# Patient Record
Sex: Female | Born: 1960 | Race: Black or African American | Hispanic: No | Smoking: Never smoker
Health system: Southern US, Community
[De-identification: ages and names within clinical notes are randomized; demographics above are authoritative.]

## PROBLEM LIST (undated history)

## (undated) DIAGNOSIS — K2970 Gastritis, unspecified, without bleeding: Secondary | ICD-10-CM

## (undated) DIAGNOSIS — K5900 Constipation, unspecified: Secondary | ICD-10-CM

## (undated) DIAGNOSIS — K2960 Other gastritis without bleeding: Secondary | ICD-10-CM

## (undated) DIAGNOSIS — I10 Essential (primary) hypertension: Secondary | ICD-10-CM

## (undated) DIAGNOSIS — J309 Allergic rhinitis, unspecified: Secondary | ICD-10-CM

## (undated) DIAGNOSIS — D649 Anemia, unspecified: Secondary | ICD-10-CM

## (undated) DIAGNOSIS — J45909 Unspecified asthma, uncomplicated: Secondary | ICD-10-CM

## (undated) DIAGNOSIS — G5600 Carpal tunnel syndrome, unspecified upper limb: Secondary | ICD-10-CM

## (undated) DIAGNOSIS — E669 Obesity, unspecified: Secondary | ICD-10-CM

## (undated) DIAGNOSIS — K649 Unspecified hemorrhoids: Secondary | ICD-10-CM

## (undated) DIAGNOSIS — E119 Type 2 diabetes mellitus without complications: Secondary | ICD-10-CM

## (undated) DIAGNOSIS — R1310 Dysphagia, unspecified: Secondary | ICD-10-CM

## (undated) DIAGNOSIS — K2980 Duodenitis without bleeding: Secondary | ICD-10-CM

## (undated) HISTORY — PX: BLADDER SURGERY: SHX569

## (undated) HISTORY — PX: CARPAL TUNNEL RELEASE: SHX101

## (undated) HISTORY — PX: OOPHORECTOMY: SHX86

## (undated) HISTORY — DX: Obesity, unspecified: E66.9

## (undated) HISTORY — DX: Carpal tunnel syndrome, unspecified upper limb: G56.00

## (undated) HISTORY — DX: Allergic rhinitis, unspecified: J30.9

## (undated) HISTORY — DX: Dysphagia, unspecified: R13.10

## (undated) HISTORY — PX: MYOMECTOMY: SHX85

## (undated) HISTORY — DX: Gastritis, unspecified, without bleeding: K29.70

## (undated) HISTORY — DX: Type 2 diabetes mellitus without complications: E11.9

## (undated) HISTORY — DX: Unspecified hemorrhoids: K64.9

## (undated) HISTORY — PX: ABDOMINAL HYSTERECTOMY: SHX81

## (undated) HISTORY — DX: Essential (primary) hypertension: I10

## (undated) HISTORY — DX: Other gastritis without bleeding: K29.60

## (undated) HISTORY — DX: Anemia, unspecified: D64.9

## (undated) HISTORY — DX: Unspecified asthma, uncomplicated: J45.909

## (undated) HISTORY — DX: Duodenitis without bleeding: K29.80

## (undated) HISTORY — DX: Constipation, unspecified: K59.00

---

## 1999-09-02 ENCOUNTER — Ambulatory Visit (HOSPITAL_COMMUNITY): Admission: RE | Admit: 1999-09-02 | Discharge: 1999-09-02 | Payer: Self-pay | Admitting: Family Medicine

## 1999-09-02 ENCOUNTER — Encounter: Payer: Self-pay | Admitting: Family Medicine

## 1999-10-19 ENCOUNTER — Encounter: Payer: Self-pay | Admitting: Urology

## 1999-10-19 ENCOUNTER — Ambulatory Visit (HOSPITAL_COMMUNITY): Admission: RE | Admit: 1999-10-19 | Discharge: 1999-10-19 | Payer: Self-pay | Admitting: Urology

## 1999-11-03 ENCOUNTER — Ambulatory Visit (HOSPITAL_COMMUNITY): Admission: RE | Admit: 1999-11-03 | Discharge: 1999-11-03 | Payer: Self-pay | Admitting: Urology

## 1999-11-03 ENCOUNTER — Encounter: Payer: Self-pay | Admitting: Urology

## 2000-12-13 ENCOUNTER — Other Ambulatory Visit: Admission: RE | Admit: 2000-12-13 | Discharge: 2000-12-13 | Payer: Self-pay | Admitting: Obstetrics

## 2000-12-22 ENCOUNTER — Ambulatory Visit (HOSPITAL_COMMUNITY): Admission: RE | Admit: 2000-12-22 | Discharge: 2000-12-22 | Payer: Self-pay | Admitting: Urology

## 2000-12-22 ENCOUNTER — Encounter: Payer: Self-pay | Admitting: Urology

## 2001-03-02 ENCOUNTER — Encounter: Payer: Self-pay | Admitting: Obstetrics

## 2001-03-02 ENCOUNTER — Ambulatory Visit (HOSPITAL_COMMUNITY): Admission: RE | Admit: 2001-03-02 | Discharge: 2001-03-02 | Payer: Self-pay | Admitting: Obstetrics

## 2001-04-18 ENCOUNTER — Ambulatory Visit: Admission: RE | Admit: 2001-04-18 | Discharge: 2001-04-18 | Payer: Self-pay | Admitting: Gynecology

## 2001-04-26 ENCOUNTER — Inpatient Hospital Stay (HOSPITAL_COMMUNITY): Admission: RE | Admit: 2001-04-26 | Discharge: 2001-04-30 | Payer: Self-pay | Admitting: Obstetrics

## 2001-05-02 ENCOUNTER — Ambulatory Visit: Admission: RE | Admit: 2001-05-02 | Discharge: 2001-05-02 | Payer: Self-pay | Admitting: Gynecology

## 2001-05-15 ENCOUNTER — Ambulatory Visit (HOSPITAL_COMMUNITY): Admission: RE | Admit: 2001-05-15 | Discharge: 2001-05-15 | Payer: Self-pay | Admitting: Gynecology

## 2001-05-15 ENCOUNTER — Encounter: Payer: Self-pay | Admitting: Gynecology

## 2001-05-27 ENCOUNTER — Emergency Department (HOSPITAL_COMMUNITY): Admission: EM | Admit: 2001-05-27 | Discharge: 2001-05-28 | Payer: Self-pay | Admitting: Emergency Medicine

## 2001-06-01 ENCOUNTER — Ambulatory Visit (HOSPITAL_COMMUNITY): Admission: RE | Admit: 2001-06-01 | Discharge: 2001-06-01 | Payer: Self-pay | Admitting: Gynecology

## 2001-06-01 ENCOUNTER — Encounter: Payer: Self-pay | Admitting: Gynecology

## 2001-06-21 ENCOUNTER — Ambulatory Visit: Admission: RE | Admit: 2001-06-21 | Discharge: 2001-06-21 | Payer: Self-pay | Admitting: Gynecology

## 2002-06-05 ENCOUNTER — Ambulatory Visit (HOSPITAL_COMMUNITY): Admission: RE | Admit: 2002-06-05 | Discharge: 2002-06-05 | Payer: Self-pay | Admitting: Orthopedic Surgery

## 2002-06-18 ENCOUNTER — Encounter: Admission: RE | Admit: 2002-06-18 | Discharge: 2002-09-16 | Payer: Self-pay | Admitting: Family Medicine

## 2004-01-20 ENCOUNTER — Encounter: Admission: RE | Admit: 2004-01-20 | Discharge: 2004-04-19 | Payer: Self-pay | Admitting: Family Medicine

## 2005-11-22 HISTORY — PX: COLONOSCOPY: SHX174

## 2005-12-07 ENCOUNTER — Ambulatory Visit: Payer: Self-pay | Admitting: Family Medicine

## 2005-12-14 ENCOUNTER — Ambulatory Visit: Payer: Self-pay | Admitting: Family Medicine

## 2006-01-27 ENCOUNTER — Ambulatory Visit: Payer: Self-pay | Admitting: Family Medicine

## 2006-01-28 ENCOUNTER — Ambulatory Visit: Payer: Self-pay | Admitting: Family Medicine

## 2006-02-04 ENCOUNTER — Ambulatory Visit: Payer: Self-pay | Admitting: Internal Medicine

## 2006-02-15 ENCOUNTER — Ambulatory Visit: Payer: Self-pay | Admitting: Internal Medicine

## 2006-02-15 DIAGNOSIS — R1310 Dysphagia, unspecified: Secondary | ICD-10-CM

## 2006-02-15 DIAGNOSIS — K2960 Other gastritis without bleeding: Secondary | ICD-10-CM

## 2006-02-15 DIAGNOSIS — K2970 Gastritis, unspecified, without bleeding: Secondary | ICD-10-CM

## 2006-02-15 DIAGNOSIS — K2980 Duodenitis without bleeding: Secondary | ICD-10-CM

## 2006-02-15 HISTORY — DX: Duodenitis without bleeding: K29.80

## 2006-02-15 HISTORY — DX: Gastritis, unspecified, without bleeding: K29.70

## 2006-02-15 HISTORY — DX: Other gastritis without bleeding: K29.60

## 2006-02-15 HISTORY — DX: Dysphagia, unspecified: R13.10

## 2007-08-24 DIAGNOSIS — D649 Anemia, unspecified: Secondary | ICD-10-CM | POA: Insufficient documentation

## 2007-08-24 DIAGNOSIS — J309 Allergic rhinitis, unspecified: Secondary | ICD-10-CM | POA: Insufficient documentation

## 2007-08-24 DIAGNOSIS — E669 Obesity, unspecified: Secondary | ICD-10-CM | POA: Insufficient documentation

## 2007-08-24 DIAGNOSIS — E119 Type 2 diabetes mellitus without complications: Secondary | ICD-10-CM | POA: Insufficient documentation

## 2007-08-24 DIAGNOSIS — I10 Essential (primary) hypertension: Secondary | ICD-10-CM | POA: Insufficient documentation

## 2008-03-12 ENCOUNTER — Telehealth: Payer: Self-pay | Admitting: Internal Medicine

## 2012-03-02 ENCOUNTER — Other Ambulatory Visit (HOSPITAL_COMMUNITY): Payer: Self-pay | Admitting: Internal Medicine

## 2012-03-02 DIAGNOSIS — Z1231 Encounter for screening mammogram for malignant neoplasm of breast: Secondary | ICD-10-CM

## 2012-03-07 ENCOUNTER — Ambulatory Visit (HOSPITAL_COMMUNITY)
Admission: RE | Admit: 2012-03-07 | Discharge: 2012-03-07 | Disposition: A | Discharge status: Home / Self Care | Payer: Self-pay | Source: Ambulatory Visit | Attending: Internal Medicine | Admitting: Internal Medicine

## 2012-03-07 DIAGNOSIS — Z1231 Encounter for screening mammogram for malignant neoplasm of breast: Secondary | ICD-10-CM

## 2012-05-17 ENCOUNTER — Encounter: Payer: Self-pay | Admitting: Internal Medicine

## 2012-06-12 ENCOUNTER — Ambulatory Visit: Payer: Self-pay | Admitting: Internal Medicine

## 2012-06-13 ENCOUNTER — Encounter: Payer: Self-pay | Admitting: Internal Medicine

## 2015-01-20 ENCOUNTER — Emergency Department (HOSPITAL_COMMUNITY)
Admission: EM | Admit: 2015-01-20 | Discharge: 2015-01-20 | Disposition: A | Discharge status: Home / Self Care | Payer: Self-pay | Attending: Emergency Medicine | Admitting: Emergency Medicine

## 2015-01-20 ENCOUNTER — Encounter (HOSPITAL_COMMUNITY): Payer: Self-pay | Admitting: Emergency Medicine

## 2015-01-20 DIAGNOSIS — I10 Essential (primary) hypertension: Secondary | ICD-10-CM | POA: Insufficient documentation

## 2015-01-20 DIAGNOSIS — Z862 Personal history of diseases of the blood and blood-forming organs and certain disorders involving the immune mechanism: Secondary | ICD-10-CM | POA: Insufficient documentation

## 2015-01-20 DIAGNOSIS — Z8719 Personal history of other diseases of the digestive system: Secondary | ICD-10-CM | POA: Insufficient documentation

## 2015-01-20 DIAGNOSIS — R739 Hyperglycemia, unspecified: Secondary | ICD-10-CM

## 2015-01-20 DIAGNOSIS — J45909 Unspecified asthma, uncomplicated: Secondary | ICD-10-CM | POA: Insufficient documentation

## 2015-01-20 DIAGNOSIS — L0231 Cutaneous abscess of buttock: Secondary | ICD-10-CM | POA: Insufficient documentation

## 2015-01-20 DIAGNOSIS — E669 Obesity, unspecified: Secondary | ICD-10-CM | POA: Insufficient documentation

## 2015-01-20 DIAGNOSIS — L03317 Cellulitis of buttock: Secondary | ICD-10-CM | POA: Insufficient documentation

## 2015-01-20 DIAGNOSIS — E1165 Type 2 diabetes mellitus with hyperglycemia: Secondary | ICD-10-CM | POA: Insufficient documentation

## 2015-01-20 LAB — CBG MONITORING, ED: Glucose-Capillary: 464 mg/dL — ABNORMAL HIGH (ref 70–99)

## 2015-01-20 MED ORDER — CEPHALEXIN 500 MG PO CAPS: 500.0000 mg | ORAL_CAPSULE | Freq: Four times a day (QID) | ORAL | Status: DC

## 2015-01-20 MED ORDER — SULFAMETHOXAZOLE-TRIMETHOPRIM 800-160 MG PO TABS: 1.0000 | ORAL_TABLET | Freq: Two times a day (BID) | ORAL | Status: AC

## 2015-01-20 MED ORDER — HYDROCODONE-ACETAMINOPHEN 5-325 MG PO TABS
1.0000 | ORAL_TABLET | Freq: Once | ORAL | Status: AC
  Administered 2015-01-20: 1 via ORAL
  Filled 2015-01-20: qty 1

## 2015-01-20 MED ORDER — LIDOCAINE-EPINEPHRINE 2 %-1:100000 IJ SOLN
20.0000 mL | Freq: Once | INTRAMUSCULAR | Status: AC
  Administered 2015-01-20: 1 mL
  Filled 2015-01-20: qty 1

## 2015-01-20 NOTE — Discharge Instructions (Signed)
Return to the emergency room with worsening of symptoms, new symptoms or with symptoms that are concerning, especially fevers, nausea vomiting, increased redness, swelling, pus drainage, red streaks. Please take all of your antibiotics until finished!   You may develop abdominal discomfort or diarrhea from the antibiotic.  You may help offset this with probiotics which you can buy or get in yogurt. Do not eat  or take the probiotics until 2 hours after your antibiotic.  You have hyperglycemia that is likely diabetes. You need to be on medications to decrease your glucose levels and improve your health.  Please call your doctor for a followup appointment within 24-48 hours. When you talk to your doctor please let them know that you were seen in the emergency department and have them acquire all of your records so that they can discuss the findings with you and formulate a treatment plan to fully care for your new and ongoing problems. If you do not have a primary care provider please call the number below under ED resources to establish care with a provider and follow up.  Read below information and follow recommendations.   Cellulitis Cellulitis is an infection of the skin and the tissue beneath it. The infected area is usually red and tender. Cellulitis occurs most often in the arms and lower legs.  CAUSES  Cellulitis is caused by bacteria that enter the skin through cracks or cuts in the skin. The most common types of bacteria that cause cellulitis are staphylococci and streptococci. SIGNS AND SYMPTOMS   Redness and warmth.  Swelling.  Tenderness or pain.  Fever. DIAGNOSIS  Your health care provider can usually determine what is wrong based on a physical exam. Blood tests may also be done. TREATMENT  Treatment usually involves taking an antibiotic medicine. HOME CARE INSTRUCTIONS   Take your antibiotic medicine as directed by your health care provider. Finish the antibiotic even if you  start to feel better.  Keep the infected arm or leg elevated to reduce swelling.  Apply a warm cloth to the affected area up to 4 times per day to relieve pain.  Take medicines only as directed by your health care provider.  Keep all follow-up visits as directed by your health care provider. SEEK MEDICAL CARE IF:   You notice red streaks coming from the infected area.  Your red area gets larger or turns dark in color.  Your bone or joint underneath the infected area becomes painful after the skin has healed.  Your infection returns in the same area or another area.  You notice a swollen bump in the infected area.  You develop new symptoms.  You have a fever. SEEK IMMEDIATE MEDICAL CARE IF:   You feel very sleepy.  You develop vomiting or diarrhea.  You have a general ill feeling (malaise) with muscle aches and pains. MAKE SURE YOU:   Understand these instructions.  Will watch your condition.  Will get help right away if you are not doing well or get worse. Document Released: 08/18/2005 Document Revised: 03/25/2014 Document Reviewed: 01/24/2012 [facility] Patient Information 2015 [place], [place]. This information is not intended to replace advice given to you by your health care provider. Make sure you discuss any questions you have with your health care provider.   Emergency Department Resource Guide 1) Find a Doctor and Pay Out of Pocket Although you won't have to find out who is covered by your insurance plan, it is a good idea to ask around and  get recommendations. You will then need to call the office and see if the doctor you have chosen will accept you as a new patient and what types of options they offer for patients who are self-pay. Some doctors offer discounts or will set up payment plans for their patients who do not have insurance, but you will need to ask so you aren't surprised when you get to your appointment.  2) Contact Your Local Health Department Not  all health departments have doctors that can see patients for sick visits, but many do, so it is worth a call to see if yours does. If you don't know where your local health department is, you can check in your phone book. The CDC also has a tool to help you locate your state's health department, and many state websites also have listings of all of their local health departments.  3) Find a Walk-in Clinic If your illness is not likely to be very severe or complicated, you may want to try a walk in clinic. These are popping up all over the country in pharmacies, drugstores, and shopping centers. They're usually staffed by nurse practitioners or physician assistants that have been trained to treat common illnesses and complaints. They're usually fairly quick and inexpensive. However, if you have serious medical issues or chronic medical problems, these are probably not your best option.  No Primary Care Doctor: - Call Health Connect at  [phone] - they can help you locate a primary care doctor that  accepts your insurance, provides certain services, etc. - Physician Referral Service- [phone]  Chronic Pain Problems: Organization         Address  Phone   Notes  [facility]  [phone] Patients need to be referred by their primary care doctor.   Medication Assistance: Organization         Address  Phone   Notes  [facility] [address] [place], [place] [id number] [phone] --Must be a resident of [place] -- Must have NO insurance coverage whatsoever (no Medicaid/ Medicare, etc.) -- The pt. MUST have a primary care doctor that directs their care regularly and follows them in the community   MedAssist  [phone]   [employer]  [phone]    Agencies that provide inexpensive medical care: Organization         Address  Phone   Notes  [facility]  [phone]   [facility]    [phone]   [facility] [address] [place], [place] [address] [phone]   [facility] [address], [place] [phone]   [facility]anned Parenthood    [phone]   [facility]    [phone]   Community Health and [facility]  [address], [place] Phone:  [phone], Fax:  [phone] Hours of Operation:  9 am - 6 pm, M-F.  Also accepts Medicaid/Medicare and self-pay.  [facility]  [address], [place] Phone: [phone], Fax: [phone]. Hours of Operation:  8:30 am - 5:30 pm, M-F.  Also accepts Medicaid and self-pay.  [facility] [place] [address], [place] Phone: [phone]   [facility] [address], [place], [place] [phone], Ext. [phone] Mondays & Thursdays: 7-9 AM.  First 15 patients are seen on a  first come, first serve basis.    Medicaid-accepting [facility] Providers:  Organization         Address  Phone   Notes  [facility] [address], [place] [phone] Also accepts self-pay patients.  [facility] [address] Laurell Josephs [place], [place]  [phone]   [facility] [address], [place] [phone]   [facility] [address] [place] [phone]   Renaye Rakers [address], [place]   [phone] Only accepts [place] Access [place] patients after they have their name applied to their card.   Self-Pay (no insurance) in [facility]:  Organization         Address  Phone   Notes  Sickle Cell Patients, [facility] Internal Medicine [address] [place], [place] [phone]   [facility] [address] [place], [place] [phone]   [facility] [place]  [address], [place] [phone]   [facility]/Dr. Osei-Bonsu  [address] [place] or [address], [place] [phone] Phone number for both [place] and [place] locations is the same.  Urgent Medical and [facility] [address], [place] [phone]   [facility] [address] [place] or [address] [phone]   [facility] [address] [place] [phone], phone; [phone], fax Sees patients 1st and 3rd Saturday of every month.  Must not qualify for public or private insurance (i.e. Medicaid, Medicare, [place] Health Choice, Veterans' Benefits)  Household income should be no more than 200% of the poverty level The clinic cannot treat you if you are pregnant or think you are pregnant  Sexually transmitted diseases are not treated at the clinic.    Dental Care: Organization         Address  Phone  Notes  [facility] Department of [facility] [address] [place], [place] [phone] Accepts children up to age 18 who are enrolled in [place] or [place] Health Choice; pregnant women with a Medicaid card; and children who have applied for Medicaid or [place] Health Choice, but were declined, whose parents can pay a reduced fee at time of service.  [facility] Department of [facility]  [address], [place] [phone] Accepts children up to age 28 who are enrolled in [place] or [place] Health Choice; pregnant women with a Medicaid card; and children who have applied for Medicaid or [place] Health Choice, but were declined, whose parents can pay a reduced fee at time of service.  [facility]  [address] [place], [place] [phone] Patients are seen by appointment only. Walk-ins are not accepted. [facility] will see patients 9 years of age and older. Monday - Tuesday (8am-5pm) Most Wednesdays (8:30-5pm) $30 per  visit, cash only  [facility] Adult Dental Access PROGRAM  [address], [facility] [phone] Patients are seen by appointment only. Walk-ins are not accepted. [facility] will see patients 56 years of age and older. One Wednesday Evening (Monthly: Volunteer Based).  $30 per visit, cash only  [employer]  [phone] for adults; Children under age 67, call Graduate Pediatric Dentistry at [phone]. Children aged  4-14, please call [phone] to request a pediatric application.  Dental services are provided in all areas of dental care including fillings, crowns and bridges, complete and partial dentures, implants, gum treatment, root canals, and extractions. Preventive care is also provided. Treatment is provided to both adults and children. Patients are selected via a lottery and there is often a waiting list.   [facility] [address], [place]  [phone] [email or web address]   [facility] [address] [place], [place] [phone], Ext. [phone] Second and Fourth Thursday of each month, opens at 6:30 AM; Clinic ends at 9 AM.  Patients are seen on a first-come first-served basis, and a limited number are seen during each clinic.   [facility]  [address] Hillard Danker [place], [place] [phone]   Eligibility Requirements You must have lived in [place], [place], or [place] counties for at least the last three months.   You cannot be eligible for state or federal sponsored [employer], including [employer], [place], or [employer].   You generally cannot be eligible for healthcare insurance through your employer.    How to apply: Eligibility screenings are held every Tuesday and Wednesday afternoon from 1:00 pm until 4:00 pm. You do not need an appointment for the interview!  [facility] [address] [place], [place]   [place]   [place] Department  [place]  [phone]    Behavioral Health Resources in the Community: Intensive Outpatient Programs Organization         Address  Phone  Notes  [facility] [place] [address], [place], [place] [phone]   [facility] Outpatient [address], [place], [place]   ADS: Alcohol & Drug Svcs [address], [place], [place]   [place] [address],  [place], [place] or [phone]   Substance Abuse Resources Organization         Address  Phone  Notes  Alcohol and Drug Services  [phone]   [place]  [phone]   The [place]   Chinita Pester  [phone]   Residential & Outpatient Substance Abuse Program  [phone]   Psychological Services Organization         Address  Phone  Notes  [facility]  [place]  [phone]   [place] [address] [place] or [phone]    Mobile Crisis Teams Organization         Address  Phone  Notes  [facility], Mobile Crisis Care Unit  [phone]   [facility]  [address] [place], [place]   [place] [address] [place] [phone]    Self-Help/Support Groups Organization         Address  Phone             Notes  [place] of [place] - variety of support groups  [place] Call for more information  Narcotics Anonymous (NA), [facility]aring [facility] [address], [employer] [place]  2 meetings at this location   Special educational needs teacher         Address  Phone  Notes  [facility] [place],    [place]  [phone]   [facility]  [address], [place] [address], [place], [place] [phone]     [facility] [address] [place], [place] [phone] Admissions: 8am-3pm M-F  [facility] [address],    [place], [place] [phone]   [facility] [address] [place], [place], [place] [phone]   [facility] [address]  [place], [place] [phone]   [facility] - Intensive Outpatient [address]urell Josephs [address], [place], [place] [phone]   [facility] ([facility] [address] [place]  [place], [place] [phone] or [phone]   [facility] ([facility]) [address], [place], [place] [phone] Accepts Medicaid  [facility] [address],  [place] [phone] Substance Abuse/Addiction Treatment   [facility] Organization         Address  Phone  Notes  [facility]  [phone]   Angie Fava, PhD [address] Ervin Knack [place], [place]   [phone] or [phone]   [facility] Behavioral   [address] [place], [place] [phone]   [facility] [address] [place], [place] [phone] Insurance/Medicaid/sponsorship through [facility] and Families [address]                                    [place], [place] [phone] Therapy/tele-psych/case  [facility] [address], [place] [phone]    Dr. Lolly Mustache  [phone]   [facility] [place]  United Way [facility] Community Dept. 1) [address] [place] 2) [address], [place] 3)  [address], [place] [phone]  [phone]   [facility] Child Abuse Hotline 954-124-6292 or [phone] (After Hours)

## 2015-01-20 NOTE — ED Provider Notes (Signed)
CSN: [id number]     Arrival date & time 01/20/15  1054 History   First MD Initiated Contact with Patient 01/20/15 1115     No chief complaint on file.    (Consider location/radiation/quality/duration/timing/severity/associated sxs/prior Treatment) HPI  Lindsey Johnston is a 54 y.o. female with PMH of borderline diabetes not on medications, hypertension, obesity presenting with one week of worsening "boil" to left back. Patient states it is grown in size with increased redness. She does report bloody and purulent discharge. She denies any fevers or chills nausea or vomiting. Patient with history of boils on her face that had resolved on their own.   Past Medical History  Diagnosis Date  . Anemia   . DM (diabetes mellitus)   . Obese   . Hypertension   . Allergic rhinitis   . Asthma   . Gastritis 02/15/2006    proximal non-erosive  . Erosive gastritis 02/15/2006  . Duodenitis 02/15/2006  . Dysphagia 02/15/2006    maloney dilation  . Hemorrhoids     internal and external   Past Surgical History  Procedure Laterality Date  . Abdominal hysterectomy    . Carpal tunnel release    . Bladder surgery      growth in bladder/sounds like adhesiolysis  . Oophorectomy    . Myomectomy     Family History  Problem Relation Age of Onset  . Diabetes Mother   . Diabetes Father   . Kidney failure Mother   . Obesity Mother   . Deep vein thrombosis Mother    History  Substance Use Topics  . Smoking status: Never Smoker   . Smokeless tobacco: Never Used  . Alcohol Use: No   OB History    No data available     Review of Systems  Constitutional: Negative for fever and chills.  Gastrointestinal: Negative for nausea and vomiting.  Skin: Negative for color change.       Boil      Allergies  Review of patient's allergies indicates no known allergies.  Home Medications   Prior to Admission medications   Medication Sig Start Date End Date Taking? Authorizing Provider  cephALEXin  (KEFLEX) 500 MG capsule Take 1 capsule (500 mg total) by mouth 4 (four) times daily. 01/20/15   Louann SjogrenVictoria L Sehaj Mcenroe, PA-C  sulfamethoxazole-trimethoprim (BACTRIM DS,SEPTRA DS) 800-160 MG per tablet Take 1 tablet by mouth 2 (two) times daily. 01/20/15 01/27/15  Benetta SparVictoria L Dorn Hartshorne, PA-C   BP 164/94 mmHg  Pulse 90  Temp(Src) 98.4 F (36.9 C)  Resp 16  SpO2 100% Physical Exam  Constitutional: She appears well-developed and well-nourished. No distress.  HENT:  Head: Normocephalic and atraumatic.  Eyes: Conjunctivae are normal. Right eye exhibits no discharge. Left eye exhibits no discharge.  Pulmonary/Chest: Effort normal. No respiratory distress.  Neurological: She is alert. Coordination normal.  Skin: She is not diaphoretic.  Patient with 2 cm area of fluctuance to left outer buttocks with surrounding erythema and induration. Evidence of cellulitis. Spontaneous bloody and purulent drainage. Very tender to palpation.  Psychiatric: She has a normal mood and affect. Her behavior is normal.  Nursing note and vitals reviewed.   ED Course  Procedures (including critical care time) Labs Review Labs Reviewed  CBG MONITORING, ED - Abnormal; Notable for the following:    Glucose-Capillary 464 (*)    All other components within normal limits    Imaging Review No results found.   EKG Interpretation None      INCISION  AND DRAINAGE Performed by: Louann Sjogren Consent: Verbal consent obtained. Risks and benefits: risks, benefits and alternatives were discussed Type: abscess  Body area: left buttocks  Anesthesia: local infiltration  Incision was made with a scalpel.  Local anesthetic: lidocaine 2% with epinephrine  Anesthetic total: 2 ml  Complexity: complex Blunt dissection to break up loculations  Drainage: purulent  Drainage amount: copious  Packing material: 1/4 in iodoform gauze  Patient tolerance: Patient tolerated the procedure well with no immediate  complications.     MDM   Final diagnoses:  Hyperglycemia  Abscess of buttock, left  Cellulitis of buttock, left   Patient with skin abscess amenable to incision and drainage.  Abscess packed with gauze and wound recheck in 2 days. Encouraged home warm soaks and flushing.  Patient also with cellulitis and will discharge with Bactrim as well as Keflex. Patient CBG also 464. Discussed obtaining lab work and starting on metformin but patient refused. Stressed the importance of following up with her primary care provider as well as the importance of controlling her hyperglycemia. Pt appears reliable for follow-up.  Discussed return precautions with patient. Discussed all results and patient verbalizes understanding and agrees with plan.   Louann Sjogren, PA-C 01/20/15 2120  Layla Maw Ward, DO 01/21/15 [phone]

## 2015-01-20 NOTE — ED Notes (Signed)
Pt reports abscess on left buttocks times 1 week that is draining with pain 8/10. Pt reports several in the past month that have "gone away on their own".

## 2015-01-22 ENCOUNTER — Emergency Department (HOSPITAL_COMMUNITY)
Admission: EM | Admit: 2015-01-22 | Discharge: 2015-01-22 | Disposition: A | Discharge status: Home / Self Care | Payer: Self-pay | Attending: Emergency Medicine | Admitting: Emergency Medicine

## 2015-01-22 ENCOUNTER — Encounter (HOSPITAL_COMMUNITY): Payer: Self-pay | Admitting: Emergency Medicine

## 2015-01-22 DIAGNOSIS — Z9889 Other specified postprocedural states: Secondary | ICD-10-CM | POA: Insufficient documentation

## 2015-01-22 DIAGNOSIS — L0231 Cutaneous abscess of buttock: Secondary | ICD-10-CM | POA: Insufficient documentation

## 2015-01-22 DIAGNOSIS — J45909 Unspecified asthma, uncomplicated: Secondary | ICD-10-CM | POA: Insufficient documentation

## 2015-01-22 DIAGNOSIS — E669 Obesity, unspecified: Secondary | ICD-10-CM | POA: Insufficient documentation

## 2015-01-22 DIAGNOSIS — I10 Essential (primary) hypertension: Secondary | ICD-10-CM | POA: Insufficient documentation

## 2015-01-22 DIAGNOSIS — Z862 Personal history of diseases of the blood and blood-forming organs and certain disorders involving the immune mechanism: Secondary | ICD-10-CM | POA: Insufficient documentation

## 2015-01-22 DIAGNOSIS — Z8719 Personal history of other diseases of the digestive system: Secondary | ICD-10-CM | POA: Insufficient documentation

## 2015-01-22 DIAGNOSIS — Z792 Long term (current) use of antibiotics: Secondary | ICD-10-CM | POA: Insufficient documentation

## 2015-01-22 DIAGNOSIS — E119 Type 2 diabetes mellitus without complications: Secondary | ICD-10-CM | POA: Insufficient documentation

## 2015-01-22 MED ORDER — HYDROCODONE-ACETAMINOPHEN 5-325 MG PO TABS: 1.0000 | ORAL_TABLET | Freq: Four times a day (QID) | ORAL | Status: DC | PRN

## 2015-01-22 NOTE — ED Provider Notes (Signed)
CSN: [id number]     Arrival date & time 01/22/15  1521 History   None    Chief Complaint  Patient presents with  . Wound Check   Patient is a 54 y.o. female presenting with wound check. The history is provided by the patient. No language interpreter was used.  Wound Check   This chart was scribed for non-physician practitioner Ebbie Ridge, PA-C, working with Richardean Canal, MD, by Andrew Au, ED Scribe. This patient was seen in room WTR5/WTR5 and the patient's care was started at 3:40 PM.  Lindsey Johnston is a 54 y.o. female who presents to the Emergency Department for a wound check. Pt states she was seen here 2 days ago for an abscess to left buttocks. At that time abscess was drained. Pt reports she has been doing well since I&D and has been taking abx prescribed during that visit. Pt is here for a recheck.    Past Medical History  Diagnosis Date  . Anemia   . DM (diabetes mellitus)   . Obese   . Hypertension   . Allergic rhinitis   . Asthma   . Gastritis 02/15/2006    proximal non-erosive  . Erosive gastritis 02/15/2006  . Duodenitis 02/15/2006  . Dysphagia 02/15/2006    maloney dilation  . Hemorrhoids     internal and external   Past Surgical History  Procedure Laterality Date  . Abdominal hysterectomy    . Carpal tunnel release    . Bladder surgery      growth in bladder/sounds like adhesiolysis  . Oophorectomy    . Myomectomy     Family History  Problem Relation Age of Onset  . Diabetes Mother   . Diabetes Father   . Kidney failure Mother   . Obesity Mother   . Deep vein thrombosis Mother    History  Substance Use Topics  . Smoking status: Never Smoker   . Smokeless tobacco: Never Used  . Alcohol Use: No   OB History    No data available     Review of Systems    Allergies  Review of patient's allergies indicates no known allergies.  Home Medications   Prior to Admission medications   Medication Sig Start Date End Date Taking? Authorizing Provider   cephALEXin (KEFLEX) 500 MG capsule Take 1 capsule (500 mg total) by mouth 4 (four) times daily. 01/20/15   Louann Sjogren, PA-C  sulfamethoxazole-trimethoprim (BACTRIM DS,SEPTRA DS) 800-160 MG per tablet Take 1 tablet by mouth 2 (two) times daily. 01/20/15 01/27/15  Benetta Spar L Creech, PA-C   BP 144/82 mmHg  Pulse 76  Temp(Src) 98.7 F (37.1 C) (Oral)  Resp 20  SpO2 96% Physical Exam  Constitutional: She is oriented to person, place, and time. She appears well-developed and well-nourished. No distress.  HENT:  Head: Normocephalic and atraumatic.  Eyes: Pupils are equal, round, and reactive to light.  Neck: Normal range of motion.  Pulmonary/Chest: Effort normal.  Musculoskeletal: Normal range of motion.  Neurological: She is alert and oriented to person, place, and time.  Skin: Skin is warm and dry.  Left buttocks with tender abscess that is still draining. Tape has caused irritation around abscess.   Psychiatric: She has a normal mood and affect. Her behavior is normal.  Nursing note and vitals reviewed.   ED Course  Procedures (including critical care time) DIAGNOSTIC STUDIES: Oxygen Saturation is 96% on RA, normal by my interpretation.    COORDINATION OF CARE: 4:06  PM- Pt advised of plan for treatment and pt agrees.  Patient is advised return here as needed.  Told to use warm bath soaks and warm compresses and heat around the area.  Told to keep the area covered, keep it clean and dry with soap and water  MDM   Final diagnoses:  None      Carlyle DollyChristopher W Uchechukwu Dhawan, PA-C 01/22/15 1614  Richardean Canalavid H Yao, MD 01/22/15 [phone]

## 2015-01-22 NOTE — ED Notes (Signed)
Pt was here on Monday for I&D with packing placed. Pt here for recheck of wound.

## 2015-01-22 NOTE — Discharge Instructions (Signed)
Return here as needed. Follow up with your doctor or return here. Use warm bath soaks and heat around the area.

## 2015-01-22 NOTE — ED Notes (Signed)
Packing removed from right buttock area by Ebbie Ridgehris Lawyer PA. New dressing placed with bacitracin to area of wound.

## 2015-04-22 ENCOUNTER — Encounter: Payer: Self-pay | Admitting: Family Medicine

## 2015-04-22 ENCOUNTER — Ambulatory Visit (INDEPENDENT_AMBULATORY_CARE_PROVIDER_SITE_OTHER): Payer: Self-pay | Admitting: Family Medicine

## 2015-04-22 VITALS — BP 148/80 | HR 86 | Temp 98.2°F | Ht 59.0 in | Wt 179.0 lb

## 2015-04-22 DIAGNOSIS — Z0000 Encounter for general adult medical examination without abnormal findings: Secondary | ICD-10-CM

## 2015-04-22 DIAGNOSIS — E119 Type 2 diabetes mellitus without complications: Secondary | ICD-10-CM

## 2015-04-22 LAB — BASIC METABOLIC PANEL
BUN: 13 mg/dL (ref 6–23)
CHLORIDE: 100 meq/L (ref 96–112)
CO2: 26 meq/L (ref 19–32)
CREATININE: 0.54 mg/dL (ref 0.50–1.10)
Calcium: 8.8 mg/dL (ref 8.4–10.5)
GLUCOSE: 257 mg/dL — AB (ref 70–99)
Potassium: 4 mEq/L (ref 3.5–5.3)
Sodium: 136 mEq/L (ref 135–145)

## 2015-04-22 LAB — POCT GLYCOSYLATED HEMOGLOBIN (HGB A1C): HEMOGLOBIN A1C: 11.9

## 2015-04-22 MED ORDER — LISINOPRIL 5 MG PO TABS: 5.0000 mg | ORAL_TABLET | Freq: Every day | ORAL | Status: DC

## 2015-04-22 MED ORDER — METFORMIN HCL 500 MG PO TABS: 1000.0000 mg | ORAL_TABLET | Freq: Two times a day (BID) | ORAL | Status: DC

## 2015-04-22 NOTE — Assessment & Plan Note (Signed)
A1c is 11.2 at today's visit. The patient denies any polyuria, polydipsia, or numbness/tingling. In the past, she's been on glipizide and metformin. She states she has not had any side effects due to this combination. -BMET today to assess renal function -We'll start metformin 500 mg twice a day and increased to 1000 mg twice a day as tolerated -Patient has an appointment for an ophthalmology appointment soon -Patient to follow-up in one month or sooner as needed

## 2015-04-22 NOTE — Assessment & Plan Note (Signed)
Blood pressure not controlled today. Pt on lisinopril, amlodipine, and HCTZ in the past, however nothing in the last 2 years.  No signs/symptoms of elevated BPs (no HA, change in vision, lightheadedness, dizziness). - Will start with lisinopril given h/o T2DM for renoprotection  - BMET today  - If pt's BP continues to be elevated, will add amlodipine. - Patient to f/u in 1 month

## 2015-04-22 NOTE — Progress Notes (Signed)
Patient ID: Lindsey PlaneDebra J Johnston, female   DOB: 1961/11/11, 54 y.o.   MRN: [id number]    CC: establish care HPI: Patient is a 54 y.o. female with a past medical history of HTN and T2DM presenting to clinic today to establish care. Former PCP: Dr. Quitman LivingsSami Hassan   Acute Concerns: None  Birth Control: s/p hysterectomy  Past Medical History Patient Active Problem List   Diagnosis Date Noted  . Health care maintenance 04/22/2015  . Diabetes mellitus type II, controlled 08/24/2007  . OBESITY 08/24/2007  . ANEMIA-NOS 08/24/2007  . HYPERTENSION 08/24/2007  . ALLERGIC RHINITIS 08/24/2007  Carpal tunnel  Saw an eye doctor who referred her to someone else for a consult due to concerns for possible glaucoma. No pain with EOMI, change in vision, or conjunctival injection  Medications- reviewed and updated Current Outpatient Prescriptions  Medication Sig Dispense Refill  . aspirin 81 MG tablet Take 81 mg by mouth daily as needed for pain (pain).    Marland Kitchen. ibuprofen (ADVIL,MOTRIN) 200 MG tablet Take 600 mg by mouth every 6 (six) hours as needed for moderate pain (pain).    . Multiple Vitamins-Minerals (CENTRUM ADULTS PO) Take 1 tablet by mouth daily.    Marland Kitchen. lisinopril (PRINIVIL,ZESTRIL) 5 MG tablet Take 1 tablet (5 mg total) by mouth daily. 30 tablet 1  . metFORMIN (GLUCOPHAGE) 500 MG tablet Take 2 tablets (1,000 mg total) by mouth 2 (two) times daily with a meal. 120 tablet 1   No current facility-administered medications for this visit.  Occasionally takes metformin 1000mg  BID (gets from sister, last had Rx in 2014) Last took the below medications in 2014: Glipizide 10mg  BID  HCTZ 25mg  QD Lisinopril 5mg  QD Amlodipine 10mg  qAM  Cancer Screening:  Pap Smear: s/p abdominal hysterectomy 2/2 fibroids around 2000 (unsure if she has a cervix).   Mammogram: 03/07/2012- normal, f/u in 1 yr   Colonoscopy: 02/15/2006- hemorrhoids, f/u in 10 yrs (2017)   Social:  History   Social History  . Marital  Status: Legally Separated    Spouse Name: N/A  . Number of Children: N/A  . Years of Education: N/A   Social History Main Topics  . Smoking status: Never Smoker   . Smokeless tobacco: Never Used  . Alcohol Use: No  . Drug Use: No  . Sexual Activity: Not on file   Other Topics Concern  . None   Social History Narrative   Lives with herself and 54 year old grandson  Works at [employer]  No alcohol or drug use  Family History  Problem Relation Age of Onset  . Diabetes Mother   . Diabetes Father   . Kidney failure Mother   . Obesity Mother   . Deep vein thrombosis Mother   . Diabetes Sister   . Hypertension Maternal Grandmother   . Hyperlipidemia Maternal Grandmother     ROS: All other systems reviewed and are negative.  Office vital signs reviewed. BP 148/80 mmHg  Pulse 86  Temp(Src) 98.2 F (36.8 C) (Oral)  Ht 4\' 11"  (1.499 m)  Wt 179 lb (81.194 kg)  BMI 36.13 kg/m2   Repeat 148/80 Physical Examination:  General: Awake, alert, well- nourished, NAD ENMT:  TMs intact, normal light reflex, no erythema, no bulging. Nasal turbinates moist. MMM, Oropharynx clear without erythema or tonsillar exudate/hypertrophy Eyes: Conjunctiva non-injected. PERRL.  Cardio: RRR, no m/r/g noted.  Pulm: No increased WOB.  CTAB, without wheezes, rhonchi or crackles noted.  GI: soft, NT/ND,+BS x4,  no hepatomegaly, no splenomegaly MSK: Normal gait and station. No gross deformities or swelling.  Skin: dry, intact, no rashes or lesions Neuro: Strength and sensation grossly intact, DTRs 2/4  A1c 11.9  Assessment/Plan: HYPERTENSION Blood pressure not controlled today. Pt on lisinopril, amlodipine, and HCTZ in the past, however nothing in the last 2 years.  No signs/symptoms of elevated BPs (no HA, change in vision, lightheadedness, dizziness). - Will start with lisinopril given h/o T2DM for renoprotection  - BMET today  - If pt's BP continues to be elevated, will add  amlodipine. - Patient to f/u in 1 month   Diabetes mellitus type II, controlled A1c is 11.2 at today's visit. The patient denies any polyuria, polydipsia, or numbness/tingling. In the past, she's been on glipizide and metformin. She states she has not had any side effects due to this combination. -BMET today to assess renal function -We'll start metformin 500 mg twice a day and increased to 1000 mg twice a day as tolerated -Patient has an appointment for an ophthalmology appointment soon -Patient to follow-up in one month or sooner as needed   Health care maintenance Patient is up-to-date on colonoscopy from our records. She should have a repeat in 2017 -Patient unsure if she had her cervix removed with her hysterectomy, and do not have those surgical records -We'll obtain previous records from prior PCP, Dr. Roseanne Reno -Patient past due for mammogram, however will not refer to this until she meets with Lindsey Johnston with financial assistance.     Orders Placed This Encounter  Procedures  . Basic Metabolic Panel  . POCT HgB A1C    Meds ordered this encounter  Medications  . metFORMIN (GLUCOPHAGE) 500 MG tablet    Sig: Take 2 tablets (1,000 mg total) by mouth 2 (two) times daily with a meal.    Dispense:  120 tablet    Refill:  1    For questions regarding this prescription please call [phone].  Marland Kitchen lisinopril (PRINIVIL,ZESTRIL) 5 MG tablet    Sig: Take 1 tablet (5 mg total) by mouth daily.    Dispense:  30 tablet    Refill:  1    Joanna Puff PGY-1, [facility]

## 2015-04-22 NOTE — Patient Instructions (Signed)
It was nice to meet you Please follow up with me in 1 month I have prescribed you metformin. Start by taking 500mg  (1 tablet) twice daily. If well tolerated, you can increased to 2 tablets (1000mg ) twice daily. Please start to acquire all the information for Lindsey Johnston  Things to do to Keep yourself Healthy - Exercise at least 30-45 minutes a day,  3-4 days a week.  - Eat a low-fat diet with lots of fruits and vegetables, up to 7-9 servings per day. - Seatbelts can save your life. Wear them always. - Smoke detectors on every level of your home, check batteries every year. - Eye Doctor - have an eye exam every 1-2 years - Safe sex - if you may be exposed to STDs, use a condom. - Alcohol If you drink, do it moderately,less than 2 drinks per day. - Health Care Power of Attorney.  Choose someone to speak for you if you are not able. - Depression is common in our stressful world.If you're feeling down or losing interest in things you normally enjoy, please come in for a visit. - Violence - If anyone is threatening or hurting you, please call immediately.

## 2015-04-22 NOTE — Assessment & Plan Note (Signed)
Patient is up-to-date on colonoscopy from our records. She should have a repeat in 2017 -Patient unsure if she had her cervix removed with her hysterectomy, and do not have those surgical records -We'll obtain previous records from prior PCP, Dr. Roseanne RenoHassan -Patient past due for mammogram, however will not refer to this until she meets with Britta MccreedyBarbara with financial assistance.

## 2016-02-04 ENCOUNTER — Emergency Department (HOSPITAL_COMMUNITY): Payer: Self-pay

## 2016-02-04 ENCOUNTER — Emergency Department (HOSPITAL_COMMUNITY)
Admission: EM | Admit: 2016-02-04 | Discharge: 2016-02-04 | Disposition: A | Discharge status: Home / Self Care | Payer: Self-pay | Attending: Emergency Medicine | Admitting: Emergency Medicine

## 2016-02-04 ENCOUNTER — Encounter (HOSPITAL_COMMUNITY): Payer: Self-pay | Admitting: Emergency Medicine

## 2016-02-04 DIAGNOSIS — Z79899 Other long term (current) drug therapy: Secondary | ICD-10-CM | POA: Insufficient documentation

## 2016-02-04 DIAGNOSIS — E669 Obesity, unspecified: Secondary | ICD-10-CM | POA: Insufficient documentation

## 2016-02-04 DIAGNOSIS — Z8719 Personal history of other diseases of the digestive system: Secondary | ICD-10-CM | POA: Insufficient documentation

## 2016-02-04 DIAGNOSIS — Z8669 Personal history of other diseases of the nervous system and sense organs: Secondary | ICD-10-CM | POA: Insufficient documentation

## 2016-02-04 DIAGNOSIS — E119 Type 2 diabetes mellitus without complications: Secondary | ICD-10-CM | POA: Insufficient documentation

## 2016-02-04 DIAGNOSIS — J45909 Unspecified asthma, uncomplicated: Secondary | ICD-10-CM | POA: Insufficient documentation

## 2016-02-04 DIAGNOSIS — I10 Essential (primary) hypertension: Secondary | ICD-10-CM | POA: Insufficient documentation

## 2016-02-04 DIAGNOSIS — Z862 Personal history of diseases of the blood and blood-forming organs and certain disorders involving the immune mechanism: Secondary | ICD-10-CM | POA: Insufficient documentation

## 2016-02-04 DIAGNOSIS — Z7982 Long term (current) use of aspirin: Secondary | ICD-10-CM | POA: Insufficient documentation

## 2016-02-04 DIAGNOSIS — R000 Tachycardia, unspecified: Secondary | ICD-10-CM | POA: Insufficient documentation

## 2016-02-04 DIAGNOSIS — Z7984 Long term (current) use of oral hypoglycemic drugs: Secondary | ICD-10-CM | POA: Insufficient documentation

## 2016-02-04 DIAGNOSIS — B349 Viral infection, unspecified: Secondary | ICD-10-CM | POA: Insufficient documentation

## 2016-02-04 MED ORDER — ONDANSETRON 4 MG PO TBDP
4.0000 mg | ORAL_TABLET | Freq: Once | ORAL | Status: AC
  Administered 2016-02-04: 4 mg via ORAL
  Filled 2016-02-04: qty 1

## 2016-02-04 MED ORDER — ONDANSETRON HCL 4 MG PO TABS: 4.0000 mg | ORAL_TABLET | Freq: Four times a day (QID) | ORAL | Status: DC

## 2016-02-04 MED ORDER — ACETAMINOPHEN 325 MG PO TABS
650.0000 mg | ORAL_TABLET | Freq: Once | ORAL | Status: AC
Start: 2016-02-04 — End: 2016-02-04
  Administered 2016-02-04: 650 mg via ORAL
  Filled 2016-02-04: qty 2

## 2016-02-04 NOTE — ED Notes (Signed)
Per pt, states fever, chills since yesterday

## 2016-02-04 NOTE — ED Provider Notes (Signed)
CSN: [id number]     Arrival date & time 02/04/16  0758 History   First MD Initiated Contact with Patient 02/04/16 [phone]     Chief Complaint  Patient presents with  . URI   (Consider location/radiation/quality/duration/timing/severity/associated sxs/prior Treatment) HPI 55 y.o. female with a hx of DM, presents to the Emergency Department today complaining of URI symptoms for the past few days. Notes associated N/V/D, chills, productive cough, generalized body aches. No CP/SOB/ABD pain. No numbness/tingling. Notes headache and states that it "feels like a vice." No changes in vision. Notes subjective fever. States headache was gradual in onset and feels like an 10/10 ache. Has not tried any OTC relief. Notes sick contacts. No recent travel.    Past Medical History  Diagnosis Date  . Anemia   . DM (diabetes mellitus) (HCC)   . Obese   . Hypertension   . Allergic rhinitis   . Asthma   . Gastritis 02/15/2006    proximal non-erosive  . Erosive gastritis 02/15/2006  . Duodenitis 02/15/2006  . Dysphagia 02/15/2006    maloney dilation  . Hemorrhoids     internal and external  . Carpal tunnel syndrome    Past Surgical History  Procedure Laterality Date  . Abdominal hysterectomy    . Carpal tunnel release    . Bladder surgery      growth in bladder/sounds like adhesiolysis  . Oophorectomy    . Myomectomy     Family History  Problem Relation Age of Onset  . Diabetes Mother   . Diabetes Father   . Kidney failure Mother   . Obesity Mother   . Deep vein thrombosis Mother   . Diabetes Sister   . Hypertension Maternal Grandmother   . Hyperlipidemia Maternal Grandmother    Social History  Substance Use Topics  . Smoking status: Never Smoker   . Smokeless tobacco: Never Used  . Alcohol Use: No   OB History    No data available     Review of Systems ROS reviewed and all are negative for acute change except as noted in the HPI.  Allergies  Review of patient's allergies indicates  no known allergies.  Home Medications   Prior to Admission medications   Medication Sig Start Date End Date Taking? Authorizing Provider  aspirin 81 MG tablet Take 81 mg by mouth daily as needed for pain (pain).    Historical Provider, MD  ibuprofen (ADVIL,MOTRIN) 200 MG tablet Take 600 mg by mouth every 6 (six) hours as needed for moderate pain (pain).    Historical Provider, MD  lisinopril (PRINIVIL,ZESTRIL) 5 MG tablet Take 1 tablet (5 mg total) by mouth daily. 04/22/15   Joanna Puff, MD  metFORMIN (GLUCOPHAGE) 500 MG tablet Take 2 tablets (1,000 mg total) by mouth 2 (two) times daily with a meal. 04/22/15   Joanna Puff, MD  Multiple Vitamins-Minerals (CENTRUM ADULTS PO) Take 1 tablet by mouth daily.    Historical Provider, MD   BP 187/98 mmHg  Pulse 121  Temp(Src) 99.2 F (37.3 C)  Resp 18  SpO2 95%   Physical Exam  Constitutional: She is oriented to person, place, and time. She appears well-developed and well-nourished. No distress.  HENT:  Head: Normocephalic and atraumatic.  Right Ear: Tympanic membrane, external ear and ear canal normal.  Left Ear: Tympanic membrane, external ear and ear canal normal.  Nose: Right sinus exhibits maxillary sinus tenderness and frontal sinus tenderness. Left sinus exhibits maxillary sinus tenderness and frontal  sinus tenderness.  Mouth/Throat: Uvula is midline, oropharynx is clear and moist and mucous membranes are normal. No trismus in the jaw. No oropharyngeal exudate, posterior oropharyngeal erythema or tonsillar abscesses.  Eyes: EOM are normal. Pupils are equal, round, and reactive to light.  Neck: Normal range of motion. Neck supple. No tracheal deviation present.  Cardiovascular: Normal rate, regular rhythm, S1 normal, S2 normal, normal heart sounds, intact distal pulses and normal pulses.   Pulmonary/Chest: Effort normal and breath sounds normal. No respiratory distress. She has no decreased breath sounds. She has no wheezes. She  has no rhonchi. She has no rales.  Abdominal: Soft. Normal appearance and bowel sounds are normal. There is no tenderness.  Musculoskeletal: Normal range of motion.  Neurological: She is alert and oriented to person, place, and time.  Skin: Skin is warm and dry.  Psychiatric: She has a normal mood and affect. Her speech is normal and behavior is normal. Thought content normal.  Nursing note and vitals reviewed.  ED Course  Procedures (including critical care time) Labs Review Labs Reviewed - No data to display  Imaging Review Dg Chest 2 View  02/04/2016  CLINICAL DATA:  55 year old female with a history of nausea and vomiting EXAM: CHEST - 2 VIEW COMPARISON:  None. FINDINGS: Cardiomediastinal silhouette projects within normal limits in size and contour. No confluent airspace disease, pneumothorax, or pleural effusion. No displaced fracture. Unremarkable appearance of the upper abdomen. IMPRESSION: No radiographic evidence of acute cardiopulmonary disease. Signed, Yvone NeuJaime S. Loreta AveWagner, DO [facility] Electronically Signed   By: Gilmer MorJaime  Wagner D.O.   On: 02/04/2016 09:18   I have personally reviewed and evaluated these images and lab results as part of my medical decision-making.   EKG Interpretation None      MDM  I have reviewed and evaluated the relevant imaging studies. I have reviewed the relevant previous healthcare records.I obtained HPI from historian.  ED Course:  Assessment: Pt is a 54yF presents with URI symptoms since yesterday . On exam, pt in NAD. Nonseptic/nontoxic appearing. VS show tachycardia. Afebrile. Lungs CTA, Heart RRR. Abdomen nontender/soft. Pt CXR negative for acute infiltrate. Patients symptoms are consistent with URI, likely viral etiology. Pt does not meet SIRS criteria based soley on Tachycardia. Pt well appearing. Most likely due to viral syndrome combined with dehydration. Discussed that antibiotics are  not indicated for viral infections. Pt will be discharged with symptomatic treatment.  Verbalizes understanding and is agreeable with plan. Pt is hemodynamically stable & in NAD prior to dc. Patient is able to ambulate. Patient able to tolerate PO.   Disposition/Plan:  DC home Additional Verbal discharge instructions given and discussed with patient.  Pt Instructed to f/u with PCP in the next 48 hours for evaluation and treatment of symptoms. Return precautions given Pt acknowledges and agrees with plan  Supervising Physician Lavera Guiseana Duo Liu, MD   Final diagnoses:  Viral syndrome      Audry Piliyler Jalina Blowers, PA-C 02/04/16 1544  Lavera Guiseana Duo Liu, MD 02/04/16 1734

## 2016-02-04 NOTE — Discharge Instructions (Signed)
Please read and follow all provided instructions.  Your diagnoses today include:  1. Viral syndrome    You appear to have an upper respiratory infection (URI). An upper respiratory tract infection, or cold, is a viral infection of the air passages leading to the lungs. It should improve gradually after 5-7 days. You may have a lingering cough that lasts for 2- 4 weeks after the infection.  Medications prescribed:   Take any prescribed medications only as directed. Treatment for your infection is aimed at treating the symptoms. There are no medications, such as antibiotics, that will cure your infection.   Home care instructions:  Follow any educational materials contained in this packet.   Your illness is contagious and can be spread to others, especially during the first 3 or 4 days. It cannot be cured by antibiotics or other medicines. Take basic precautions such as washing your hands often, covering your mouth when you cough or sneeze, and avoiding public places where you could spread your illness to others.   Please continue drinking plenty of fluids.  Use over-the-counter medicines as needed as directed on packaging for symptom relief.  You may also use ibuprofen or tylenol as directed on packaging for pain or fever.  Do not take multiple medicines containing Tylenol or acetaminophen to avoid taking too much of this medication.  Follow-up instructions: Please follow-up with your primary care provider in the next 3 days for further evaluation of your symptoms if you are not feeling better.   Return instructions:   Please return to the Emergency Department if you experience worsening symptoms.   RETURN IMMEDIATELY IF you develop shortness of breath, confusion or altered mental status, a new rash, become dizzy, faint, or poorly responsive, or are unable to be cared for at home.  Please return if you have persistent vomiting and cannot keep down fluids or develop a fever that is not  controlled by tylenol or motrin.    Please return if you have any other emergent concerns.  Additional Information:

## 2016-02-04 NOTE — ED Notes (Signed)
PA Tyler aware that pt's HR is elevated at discharge.

## 2016-03-19 ENCOUNTER — Encounter: Payer: Self-pay | Admitting: Internal Medicine

## 2016-08-06 ENCOUNTER — Encounter: Payer: Self-pay | Admitting: Pharmacist

## 2016-08-06 LAB — GLUCOSE, POCT (MANUAL RESULT ENTRY): POC GLUCOSE: 330 mg/dL — AB (ref 70–99)

## 2016-08-06 LAB — POCT GLYCOSYLATED HEMOGLOBIN (HGB A1C): Hemoglobin A1C: 12.5

## 2016-08-06 NOTE — Progress Notes (Unsigned)
[facility] by Lavinia SharpsMary Ann Placey, NP.  BP and BG (330) elevated today (A1C 12.5), patient reports no signs/symptoms of concern. Patient states she has been out of medications for years. She also states she has cysts under arms, self-care education provided, [facility] (can also [facility]).  Patient started on lisinopril-hydrochlorothiazide. Provided 1-week supply of 10/12.5 mg, 2 tablets daily. She was also advised to increase metformin to 1000 mg BID (stated she had been taking 500 mg BID).  Education provided on healthy diet/exercise. Patient verbalized understanding of information by repeat-back.

## 2016-08-10 ENCOUNTER — Other Ambulatory Visit: Payer: Self-pay | Admitting: Family Medicine

## 2016-08-10 MED ORDER — LISINOPRIL-HYDROCHLOROTHIAZIDE 10-12.5 MG PO TABS: 2.0000 | ORAL_TABLET | Freq: Every day | ORAL | 0 refills | Status: DC

## 2016-08-10 MED ORDER — METFORMIN HCL 500 MG PO TABS: 1000.0000 mg | ORAL_TABLET | Freq: Two times a day (BID) | ORAL | 0 refills | Status: DC

## 2016-08-10 NOTE — Telephone Encounter (Signed)
BP meds sent for 3 month supply. Pt needs to f/u in our clinic- it has been over 1 year since I saw her.  Thanks, Joanna Puffrystal S. Dorsey, MD [facility] Resident  08/10/2016, 12:33 PM

## 2016-08-10 NOTE — Telephone Encounter (Signed)
Patient asks refill for lisinopril and [facility]. Patient has an appointment on October 3. Please, follow up.

## 2016-08-11 NOTE — Telephone Encounter (Signed)
appt 08/24/16 Fleeger, Lindsey RochesterJessica Dawn, CMA

## 2016-08-19 ENCOUNTER — Ambulatory Visit: Payer: Self-pay | Attending: Internal Medicine

## 2016-08-24 ENCOUNTER — Ambulatory Visit (INDEPENDENT_AMBULATORY_CARE_PROVIDER_SITE_OTHER): Payer: No Typology Code available for payment source | Admitting: Family Medicine

## 2016-08-24 ENCOUNTER — Encounter: Payer: Self-pay | Admitting: Family Medicine

## 2016-08-24 VITALS — BP 171/84 | HR 86 | Temp 98.3°F | Ht 59.0 in | Wt 178.0 lb

## 2016-08-24 DIAGNOSIS — E119 Type 2 diabetes mellitus without complications: Secondary | ICD-10-CM

## 2016-08-24 DIAGNOSIS — I10 Essential (primary) hypertension: Secondary | ICD-10-CM

## 2016-08-24 DIAGNOSIS — E1165 Type 2 diabetes mellitus with hyperglycemia: Secondary | ICD-10-CM

## 2016-08-24 DIAGNOSIS — Z1159 Encounter for screening for other viral diseases: Secondary | ICD-10-CM

## 2016-08-24 DIAGNOSIS — Z0000 Encounter for general adult medical examination without abnormal findings: Secondary | ICD-10-CM

## 2016-08-24 DIAGNOSIS — L0291 Cutaneous abscess, unspecified: Secondary | ICD-10-CM

## 2016-08-24 DIAGNOSIS — Z23 Encounter for immunization: Secondary | ICD-10-CM

## 2016-08-24 LAB — BASIC METABOLIC PANEL
BUN: 11 mg/dL (ref 7–25)
CALCIUM: 9.7 mg/dL (ref 8.6–10.4)
CO2: 27 mmol/L (ref 20–31)
CREATININE: 0.62 mg/dL (ref 0.50–1.05)
Chloride: 96 mmol/L — ABNORMAL LOW (ref 98–110)
GLUCOSE: 268 mg/dL — AB (ref 65–99)
Potassium: 3.9 mmol/L (ref 3.5–5.3)
Sodium: 135 mmol/L (ref 135–146)

## 2016-08-24 LAB — POCT GLYCOSYLATED HEMOGLOBIN (HGB A1C): Hemoglobin A1C: 11.8

## 2016-08-24 MED ORDER — LISINOPRIL-HYDROCHLOROTHIAZIDE 10-12.5 MG PO TABS: 2.0000 | ORAL_TABLET | Freq: Every day | ORAL | 0 refills | Status: DC

## 2016-08-24 MED ORDER — METFORMIN HCL 500 MG PO TABS: 1000.0000 mg | ORAL_TABLET | Freq: Two times a day (BID) | ORAL | 0 refills | Status: DC

## 2016-08-24 MED ORDER — FLUTICASONE PROPIONATE 50 MCG/ACT NA SUSP: 2.0000 | Freq: Every day | NASAL | 1 refills | Status: AC

## 2016-08-24 MED ORDER — SULFAMETHOXAZOLE-TRIMETHOPRIM 800-160 MG PO TABS: 1.0000 | ORAL_TABLET | Freq: Two times a day (BID) | ORAL | 0 refills | Status: DC

## 2016-08-24 MED ORDER — DOXYCYCLINE HYCLATE 100 MG PO TABS: 100.0000 mg | ORAL_TABLET | Freq: Two times a day (BID) | ORAL | 0 refills | Status: DC

## 2016-08-24 NOTE — Patient Instructions (Signed)
I have prescribed you doxycycline for the skin infection, take it twice daily. I have prescribed Flonase, a nasal spray, for the facial pain/congestion. I have refilled the blood pressure and diabetes medications. Please follow up with me in 1 month or sooner as needed.  I am getting labwork today, I will call you if the labs are NOT normal, otherwise I will mail you a letter.    Diabetes and Exercise Exercising regularly is important. It is not just about losing weight. It has many health benefits, such as:  Improving your overall fitness, flexibility, and endurance.  Increasing your bone density.  Helping with weight control.  Decreasing your body fat.  Increasing your muscle strength.  Reducing stress and tension.  Improving your overall health. People with diabetes who exercise gain additional benefits because exercise:  Reduces appetite.  Improves the body's use of blood sugar (glucose).  Helps lower or control blood glucose.  Decreases blood pressure.  Helps control blood lipids (such as cholesterol and triglycerides).  Improves the body's use of the hormone insulin by:  Increasing the body's insulin sensitivity.  Reducing the body's insulin needs.  Decreases the risk for heart disease because exercising:  Lowers cholesterol and triglycerides levels.  Increases the levels of good cholesterol (such as high-density lipoproteins [HDL]) in the body.  Lowers blood glucose levels. YOUR ACTIVITY PLAN  Choose an activity that you enjoy, and set realistic goals. To exercise safely, you should begin practicing any new physical activity slowly, and gradually increase the intensity of the exercise over time. Your health care provider or diabetes educator can help create an activity plan that works for you. General recommendations include:  Encouraging children to engage in at least 60 minutes of physical activity each day.  Stretching and performing strength training  exercises, such as yoga or weight lifting, at least 2 times per week.  Performing a total of at least 150 minutes of moderate-intensity exercise each week, such as brisk walking or water aerobics.  Exercising at least 3 days per week, making sure you allow no more than 2 consecutive days to pass without exercising.  Avoiding long periods of inactivity (90 minutes or more). When you have to spend an extended period of time sitting down, take frequent breaks to walk or stretch. RECOMMENDATIONS FOR EXERCISING WITH TYPE 1 OR TYPE 2 DIABETES   Check your blood glucose before exercising. If blood glucose levels are greater than 240 mg/dL, check for urine ketones. Do not exercise if ketones are present.  Avoid injecting insulin into areas of the body that are going to be exercised. For example, avoid injecting insulin into:  The arms when playing tennis.  The legs when jogging.  Keep a record of:  Food intake before and after you exercise.  Expected peak times of insulin action.  Blood glucose levels before and after you exercise.  The type and amount of exercise you have done.  Review your records with your health care provider. Your health care provider will help you to develop guidelines for adjusting food intake and insulin amounts before and after exercising.  If you take insulin or oral hypoglycemic agents, watch for signs and symptoms of hypoglycemia. They include:  Dizziness.  Shaking.  Sweating.  Chills.  Confusion.  Drink plenty of water while you exercise to prevent dehydration or heat stroke. Body water is lost during exercise and must be replaced.  Talk to your health care provider before starting an exercise program to make sure it  is safe for you. Remember, almost any type of activity is better than none.   This information is not intended to replace advice given to you by your health care provider. Make sure you discuss any questions you have with your health  care provider.   Document Released: 01/29/2004 Document Revised: 03/25/2015 Document Reviewed: 04/17/2013 Elsevier Interactive Patient Education [employer]2016 Elsevier Inc.

## 2016-08-24 NOTE — Progress Notes (Signed)
Subjective: CC: DM, HTN, facial pain HPI: Patient is a 55 y.o. female with a past medical history of DM, HTN, and obesity presenting to clinic today for a follow up on these things after she went to a free clinic and was noted to have abnormal values. A1c 184/106, CBG 330, Ac1 12.5.   She established care in our clinic in 2016, however has not followed up in over 1 year.   Hypertension Doesn't check BPs at home Meds: she notes that she had not taken her medications for almost 1 year. Called in 2 weeks ago to get a Rx for lisinopril-HCTZ which she has taken since then.  Side effects: none  ROS: Denies headache, dizziness, visual changes, nausea, vomiting, chest pain, abdominal pain or shortness of breath.   Diabetes:  CBGs at home: doesn't check Taking medications: restarted metformin 1000mg  BID 2 weeks ago Side effects: none  ROS: denies fever, chills, dizziness, LOC, polyuria, polydipsia, numbness or tingling in extremities or chest pain. Last eye exam: never had Last foot exam: unsure Last A1c: 11.9 on 03/2015 from our system Nephropathy screen indicated?: no  Last flu, zoster and/or pneumovax: due for flu vaccine, pneumococcal vaccine.   "Boils"/infection: Also notes concerns for "an infection somewhere in her body." she notes she had a bump on her buttocks that popped and released a thick yellow/bloody material. Also noted she had something similar under her L axilla that went away. She still fears she has some sort of infection around  Nasal congestion: Additionally worried she needs an antibiotics as she's had significant facial pain and nasal congestion for several months now. She went to the ED who did not prescribe her anything and told her it would go away. She notes difficulty with smell and taste due to congestion. Also notes facial pain. No headaches or otalgias. No fevers or chills.   She also thinks she may need antibiotics as she was bit by a lot of fleas on her  legs. No erythema or warmth, just pruritic.   Social History: never smoker   Health Maintenance: due for everything as [facility].   ROS: All other systems reviewed and are negative.  Past Medical History Patient Active Problem List   Diagnosis Date Noted  . Abscess 08/26/2016  . Health care maintenance 04/22/2015  . Diabetes mellitus (HCC) 08/24/2007  . OBESITY 08/24/2007  . ANEMIA-NOS 08/24/2007  . HYPERTENSION 08/24/2007  . ALLERGIC RHINITIS 08/24/2007    Medications- reviewed and updated  Objective: Office vital signs reviewed. BP (!) 171/84 (BP Location: Right Arm, Patient Position: Sitting, Cuff Size: Normal)   Pulse 86   Temp 98.3 F (36.8 C) (Oral)   Ht 4\' 11"  (1.499 m)   Wt 178 lb (80.7 kg)   BMI 35.95 kg/m    Physical Examination:  General: Awake, alert, well- nourished, NAD ENMT:  TMs intact, normal light reflex, no erythema, no bulging. Nasal turbinates moist. MMM, Oropharynx clear without erythema or tonsillar exudate/hypertrophy. Maxillary tenderness.  Eyes: Conjunctiva non-injected. PERRL.  Cardio: RRR, no m/r/g noted.  Pulm: No increased WOB.  CTAB, without wheezes, rhonchi or crackles noted.  Extremities: No pitting edema MSK: Normal gait and station Skin: dry, intact, small erythematous/pink oval shaped pink skin on the right gluteal cleft measuring approximately 2x1cm with mild erythema surrounding the area. No drainage noted. Exam performed with chaperone.   Assessment/Plan: HYPERTENSION BP not controlled per JNC 8 guidelines, however has  only been on regimen x 1-2 weeks. No symptoms of elevated BPs.  - continue lisinopril-HCTZ - pt to f/u in 1 month, may consider addition of amlodipine if needed.   ALLERGIC RHINITIS Patient's symptoms consistent with allergic rhinitis with some facial tenderness, I suspect some allergic sinusitis as well. No infectious type symptoms - Flonase BID x 1 week, then daily  after that - return precautions discussed.   Diabetes mellitus (HCC) Not well controlled. Patient will most likely need insulin in the near future, however given her lack of follow up previously, I am hesitant to add too much at once. Given significant hyperglycemia, will check BMET.  - continue metformin 1000mg  BID (no side effects currently) - discussed lifestyle modifications pt would benefit from nutrition consult in the future.  - advised to make an ophthalmology appt - restarted lisinopril for renoprotection. - pt to f/u in 1 month, will check CBG at that time.   Health care maintenance Flu vaccine performed today. HIV and hepatitis C testing performed today. - she's due for a repeat colonoscopy this year. - past due for mammogram, pap smear, pneumococcal, and TDap vaccines. The goal is for her to show up regularly so that we can start checking off these health maintenance requirements slowly.  Abscess Unsure if the area on her buttocks was a boil vs abscess that rupture. There is mild erythema surrounding the area. - patient unable to afford doxycycline, will write for Bactrim BID.  - continue to keep the area clean. - return precautions discussed.    Orders Placed This Encounter  Procedures  . Flu Vaccine QUAD 36+ mos IM  . Basic Metabolic Panel  . HIV antibody  . Hepatitis C antibody  . POCT HgB A1C (CPT 83036)    Meds ordered this encounter  Medications  . metFORMIN (GLUCOPHAGE) 500 MG tablet    Sig: Take 2 tablets (1,000 mg total) by mouth 2 (two) times daily with a meal.    Dispense:  360 tablet    Refill:  0  . lisinopril-hydrochlorothiazide (PRINZIDE,ZESTORETIC) 10-12.5 MG tablet    Sig: Take 2 tablets by mouth daily.    Dispense:  180 tablet    Refill:  0  . DISCONTD: doxycycline (VIBRA-TABS) 100 MG tablet    Sig: Take 1 tablet (100 mg total) by mouth 2 (two) times daily.    Dispense:  20 tablet    Refill:  0  . fluticasone (FLONASE) 50 MCG/ACT nasal  spray    Sig: Place 2 sprays into both nostrils daily.    Dispense:  16 g    Refill:  1  . sulfamethoxazole-trimethoprim (BACTRIM DS,SEPTRA DS) 800-160 MG tablet    Sig: Take 1 tablet by mouth 2 (two) times daily.    Dispense:  20 tablet    Refill:  0    Joanna Puffrystal S. Vear Staton PGY-3, [facility]

## 2016-08-25 LAB — HIV ANTIBODY (ROUTINE TESTING W REFLEX): HIV: NONREACTIVE

## 2016-08-25 LAB — HEPATITIS C ANTIBODY: HCV Ab: NEGATIVE

## 2016-08-26 ENCOUNTER — Encounter: Payer: Self-pay | Admitting: Family Medicine

## 2016-08-26 DIAGNOSIS — L0291 Cutaneous abscess, unspecified: Secondary | ICD-10-CM | POA: Insufficient documentation

## 2016-08-26 NOTE — Assessment & Plan Note (Signed)
Patient's symptoms consistent with allergic rhinitis with some facial tenderness, I suspect some allergic sinusitis as well. No infectious type symptoms - Flonase BID x 1 week, then daily after that - return precautions discussed.

## 2016-08-26 NOTE — Assessment & Plan Note (Signed)
BP not controlled per JNC 8 guidelines, however has only been on regimen x 1-2 weeks. No symptoms of elevated BPs.  - continue lisinopril-HCTZ - pt to f/u in 1 month, may consider addition of amlodipine if needed.

## 2016-08-26 NOTE — Assessment & Plan Note (Signed)
Not well controlled. Patient will most likely need insulin in the near future, however given her lack of follow up previously, I am hesitant to add too much at once. Given significant hyperglycemia, will check BMET.  - continue metformin 1000mg  BID (no side effects currently) - discussed lifestyle modifications pt would benefit from nutrition consult in the future.  - advised to make an ophthalmology appt - restarted lisinopril for renoprotection. - pt to f/u in 1 month, will check CBG at that time.

## 2016-08-26 NOTE — Assessment & Plan Note (Signed)
Unsure if the area on her buttocks was a boil vs abscess that rupture. There is mild erythema surrounding the area. - patient unable to afford doxycycline, will write for Bactrim BID.  - continue to keep the area clean. - return precautions discussed.

## 2016-08-26 NOTE — Assessment & Plan Note (Signed)
Flu vaccine performed today. HIV and hepatitis C testing performed today. - she's due for a repeat colonoscopy this year. - past due for mammogram, pap smear, pneumococcal, and TDap vaccines. The goal is for her to show up regularly so that we can start checking off these health maintenance requirements slowly.

## 2016-09-21 ENCOUNTER — Ambulatory Visit (INDEPENDENT_AMBULATORY_CARE_PROVIDER_SITE_OTHER): Payer: No Typology Code available for payment source | Admitting: Family Medicine

## 2016-09-21 VITALS — BP 124/64 | HR 85 | Temp 98.6°F | Ht 59.0 in | Wt 182.0 lb

## 2016-09-21 DIAGNOSIS — E1165 Type 2 diabetes mellitus with hyperglycemia: Secondary | ICD-10-CM

## 2016-09-21 DIAGNOSIS — J0101 Acute recurrent maxillary sinusitis: Secondary | ICD-10-CM

## 2016-09-21 DIAGNOSIS — Z23 Encounter for immunization: Secondary | ICD-10-CM

## 2016-09-21 LAB — GLUCOSE, CAPILLARY: GLUCOSE-CAPILLARY: 298 mg/dL — AB (ref 65–99)

## 2016-09-21 MED ORDER — AMOXICILLIN-POT CLAVULANATE 875-125 MG PO TABS: 1.0000 | ORAL_TABLET | Freq: Two times a day (BID) | ORAL | 0 refills | Status: DC

## 2016-09-21 MED ORDER — RELION PRIME MONITOR DEVI: 0 refills | Status: AC

## 2016-09-21 MED ORDER — GLUCOSE BLOOD VI STRP: ORAL_STRIP | 1 refills | Status: AC

## 2016-09-21 MED ORDER — GLIPIZIDE 5 MG PO TABS
2.5000 mg | ORAL_TABLET | Freq: Two times a day (BID) | ORAL | 1 refills | Status: DC
Start: 2016-09-21 — End: 2016-11-03

## 2016-09-21 NOTE — Progress Notes (Signed)
Subjective: CC: DM, facial pain HPI: Patient is a 55 y.o. female with a past medical history of DM, HTN, and obesity presenting to clinic today for a follow up on these things after she went to a free clinic and was noted to have abnormal values. A1c 184/106, CBG 330, Ac1 12.5.   She established care in our clinic in 2016, however has not followed up in over 1 year.   Hypertension Doesn't check BPs at home Meds: has been taking lisinopril-HCTZ regularly since our last appt.  Side effects: none  ROS: Denies headache, dizziness, visual changes, nausea, vomiting, chest pain, abdominal pain or shortness of breath.   Diabetes:  CBGs at home: doesn't check Taking medications: on metformin 1000mg  BID  Side effects: none (has stable constipation) ROS: denies fever, chills, dizziness, LOC, polyuria, polydipsia, numbness or tingling in extremities or chest pain. Last eye exam: never had (states she can't afford) Last foot exam: unsure Last A1c: 11.9 on 03/2015 from our system Nephropathy screen indicated?: no  Last flu, zoster and/or pneumovax: due for flu vaccine, pneumococcal vaccine.   Nasal congestion: was prescribed Flonase at her last visit. She felt sinus congestion got better, but then got worse.  Notes today is a lot worse, worse with holding her head down. She has tooth and eye pain and headache on the R side when looking down. Noted blurred vision on and off in the R eye over the last few months. She notes she saw an eye doctor who states she should be on drops lifelong for some condition.  No fevers or chills. She notes difficulty with smell and taste due to congestion.  No otalgias or pain with eating.   Social History: never smoker   Health Maintenance: due for everything as [facility].   ROS: All other systems reviewed and are negative.  Past Medical History Patient Active Problem List   Diagnosis Date Noted  . Sinusitis  09/22/2016  . Abscess 08/26/2016  . Health care maintenance 04/22/2015  . Diabetes mellitus (HCC) 08/24/2007  . OBESITY 08/24/2007  . ANEMIA-NOS 08/24/2007  . HYPERTENSION 08/24/2007  . ALLERGIC RHINITIS 08/24/2007    Medications- reviewed and updated  Objective: Office vital signs reviewed. BP 124/64   Pulse 85   Temp 98.6 F (37 C) (Oral)   Ht 4\' 11"  (1.499 m)   Wt 182 lb (82.6 kg)   BMI 36.76 kg/m    Physical Examination:  General: Awake, alert, well- nourished, NAD ENMT:  TMs intact, normal light reflex, no erythema, no bulging. Nasal turbinates boggy. MMM, Oropharynx clear without erythema or tonsillar exudate/hypertrophy. Maxillary and frontal tenderness. Pain worse with looking downwards. No TMJ noted.  Eyes: Conjunctiva non-injected. PERRL.  Cardio: RRR, no m/r/g noted.  Pulm: No increased WOB.  CTAB, without wheezes, rhonchi or crackles noted.  Extremities: No pitting edema. Normal monofilament exam bilaterally.   Assessment/Plan: HYPERTENSION BP at goal. - continue current regimen  Diabetes mellitus (HCC) Blood sugars still not under control. Tolerating metformin well. Patient very reluctant to start insulin which we discussed would be typical regimen with a A1c like hers.  - continue metformin 1000mg  BID - start glipizide 2.5mg  BID. Patient can verbalize hypoglycemic symptoms.  - patient does not have medication insurance per her report. [facility] which is the most inexpensive glucometer and strips without insurance.  -Foot exam performed today without any evidence of neuropathy -Referral to ophthalmology. -  patient to f/u in 2 months or sooner as needed.   Sinusitis Patient presenting with worsening sinusitis concerning for bacterial infection. Other comorbidities include uncontrolled hyperglycemia due to type 2 diabetes. Given her comorbidities, will treat with antibiotics. -Prescription for Augmentin twice a day. -Return precautions  discussed. -Advised her to continue the nasal saline washes followed by Flonase daily  Health maintenance: Flu and pneumococcal vaccine given today. Patient to follow-up for Pap smear and TDap. Information on mammogram provided. Patient would like to defer colonoscopy currently due to financial strains.  Orders Placed This Encounter  Procedures  . Pneumococcal polysaccharide vaccine 23-valent greater than or equal to 2yo subcutaneous/IM  . Glucose, capillary  . Ambulatory referral to Ophthalmology    Referral Priority:   Routine    Referral Type:   Consultation    Referral Reason:   Specialty Services Required    Requested Specialty:   Ophthalmology    Number of Visits Requested:   1    Meds ordered this encounter  Medications  . glipiZIDE (GLUCOTROL) 5 MG tablet    Sig: Take 0.5 tablets (2.5 mg total) by mouth 2 (two) times daily before a meal.    Dispense:  30 tablet    Refill:  1  . Blood Glucose Monitoring Suppl (RELION PRIME MONITOR) DEVI    Sig: Use to check blood sugar once daily    Dispense:  1 Device    Refill:  0  . glucose blood (RELION PRIME TEST) test strip    Sig: Use as instructed    Dispense:  50 each    Refill:  1  . amoxicillin-clavulanate (AUGMENTIN) 875-125 MG tablet    Sig: Take 1 tablet by mouth 2 (two) times daily.    Dispense:  14 tablet    Refill:  0    Joanna Puffrystal S. Lasheena Frieze PGY-3, [facility]

## 2016-09-21 NOTE — Patient Instructions (Signed)
It was nice to see you again.  Because you are still having symptoms, I have prescribed you an antibiotic to take for 7 days total.  I have added glipizide, 2.5mg  daily. Please follow up with me in 2-4 weeks.  Glipizide tablets What is this medicine? GLIPIZIDE (GLIP i zide) helps to treat type 2 diabetes. Treatment is combined with diet and exercise. The medicine helps your body to use insulin better. This medicine may be used for other purposes; ask your health care provider or pharmacist if you have questions. What should I tell my health care provider before I take this medicine? They need to know if you have any of these conditions: -diabetic ketoacidosis -glucose-6-phosphate dehydrogenase deficiency -heart disease -kidney disease -liver disease -porphyria -severe infection or injury -thyroid disease -an unusual or allergic reaction to glipizide, sulfa drugs, other medicines, foods, dyes, or preservatives -pregnant or trying to get pregnant -breast-feeding How should I use this medicine? Take this medicine by mouth. Swallow with a drink of water. Do not take with food. Take it 30 minutes before a meal. Follow the directions on the prescription label. If you take this medicine once a day, take it 30 minutes before breakfast. Take your doses at the same time each day. Do not take more often than directed. Talk to your pediatrician regarding the use of this medicine in children. Special care may be needed. Elderly patients over 55 years old may have a stronger reaction and need a smaller dose. Overdosage: If you think you have taken too much of this medicine contact a poison control center or emergency room at once. NOTE: This medicine is only for you. Do not share this medicine with others. What if I miss a dose? If you miss a dose, take it as soon as you can. If it is almost time for your next dose, take only that dose. Do not take double or extra doses. What may interact with this  medicine? -bosentan -chloramphenicol -cisapride -clarithromycin -medicines for fungal or yeast infections -metoclopramide -probenecid -warfarin Many medications may cause an increase or decrease in blood sugar, these include: -alcohol containing beverages -aspirin and aspirin-like drugs -chloramphenicol -chromium -diuretics -female hormones, like estrogens or progestins and birth control pills -heart medicines -isoniazid -female hormones or anabolic steroids -medicines for weight loss -medicines for allergies, asthma, cold, or cough -medicines for mental problems -medicines called MAO Inhibitors like Nardil, Parnate, Marplan, Eldepryl -niacin -NSAIDs, medicines for pain and inflammation, like ibuprofen or naproxen -pentamidine -phenytoin -probenecid -quinolone antibiotics like ciprofloxacin, levofloxacin, ofloxacin -some herbal dietary supplements -steroid medicines like prednisone or cortisone -thyroid medicine This list may not describe all possible interactions. Give your health care provider a list of all the medicines, herbs, non-prescription drugs, or dietary supplements you use. Also tell them if you smoke, drink alcohol, or use illegal drugs. Some items may interact with your medicine. What should I watch for while using this medicine? Visit your doctor or health care professional for regular checks on your progress. A test called the HbA1C (A1C) will be monitored. This is a simple blood test. It measures your blood sugar control over the last 2 to 3 months. You will receive this test every 3 to 6 months. Learn how to check your blood sugar. Learn the symptoms of low and high blood sugar and how to manage them. Always carry a quick-source of sugar with you in case you have symptoms of low blood sugar. Examples include hard sugar candy or glucose tablets. Make  sure others know that you can choke if you eat or drink when you develop serious symptoms of low blood sugar, such  as seizures or unconsciousness. They must get medical help at once. Tell your doctor or health care professional if you have high blood sugar. You might need to change the dose of your medicine. If you are sick or exercising more than usual, you might need to change the dose of your medicine. Do not skip meals. Ask your doctor or health care professional if you should avoid alcohol. Many nonprescription cough and cold products contain sugar or alcohol. These can affect blood sugar. This medicine can make you more sensitive to the sun. Keep out of the sun. If you cannot avoid being in the sun, wear protective clothing and use sunscreen. Do not use sun lamps or tanning beds/booths. Wear a medical ID bracelet or chain, and carry a card that describes your disease and details of your medicine and dosage times. What side effects may I notice from receiving this medicine? Side effects that you should report to your doctor or health care professional as soon as possible: -allergic reactions like skin rash, itching or hives, swelling of the face, lips, or tongue -breathing problems -dark urine -fever, chills, sore throat -signs and symptoms of low blood sugar such as feeling anxious, confusion, dizziness, increased hunger, unusually weak or tired, sweating, shakiness, cold, irritable, headache, blurred vision, fast heartbeat, loss of consciousness -unusual bleeding or bruising -yellowing of the eyes or skin Side effects that usually do not require medical attention (report to your doctor or health care professional if they continue or are bothersome): -diarrhea -dizziness -headache -heartburn -nausea -stomach gas This list may not describe all possible side effects. Call your doctor for medical advice about side effects. You may report side effects to FDA at 1-800-FDA-1088. Where should I keep my medicine? Keep out of the reach of children. Store at room temperature below 30 degrees C (86 degrees F).  Throw away any unused medicine after the expiration date. NOTE: This sheet is a summary. It may not cover all possible information. If you have questions about this medicine, talk to your doctor, pharmacist, or health care provider.    2016, [employer]. (2013-02-21 14:42:46)

## 2016-09-22 DIAGNOSIS — J329 Chronic sinusitis, unspecified: Secondary | ICD-10-CM | POA: Insufficient documentation

## 2016-09-22 NOTE — Assessment & Plan Note (Signed)
Patient presenting with worsening sinusitis concerning for bacterial infection. Other comorbidities include uncontrolled hyperglycemia due to type 2 diabetes. Given her comorbidities, will treat with antibiotics. -Prescription for Augmentin twice a day. -Return precautions discussed. -Advised her to continue the nasal saline washes followed by Flonase daily

## 2016-09-22 NOTE — Assessment & Plan Note (Addendum)
Blood sugars still not under control. Tolerating metformin well. Patient very reluctant to start insulin which we discussed would be typical regimen with a A1c like hers.  - continue metformin 1000mg  BID - start glipizide 2.5mg  BID. Patient can verbalize hypoglycemic symptoms.  - patient does not have medication insurance per her report. [facility] which is the most inexpensive glucometer and strips without insurance.  -Foot exam performed today without any evidence of neuropathy -Referral to ophthalmology. - patient to f/u in 2 months or sooner as needed.

## 2016-09-22 NOTE — Assessment & Plan Note (Signed)
BP at goal continue current regimen.  

## 2016-10-06 ENCOUNTER — Encounter: Payer: Self-pay | Admitting: Family Medicine

## 2016-10-06 ENCOUNTER — Ambulatory Visit (INDEPENDENT_AMBULATORY_CARE_PROVIDER_SITE_OTHER): Payer: No Typology Code available for payment source | Admitting: Family Medicine

## 2016-10-06 VITALS — BP 144/82 | HR 81 | Temp 98.3°F | Ht 59.0 in | Wt 184.0 lb

## 2016-10-06 DIAGNOSIS — E1165 Type 2 diabetes mellitus with hyperglycemia: Secondary | ICD-10-CM

## 2016-10-06 DIAGNOSIS — Z23 Encounter for immunization: Secondary | ICD-10-CM

## 2016-10-06 LAB — GLUCOSE, POCT (MANUAL RESULT ENTRY): POC GLUCOSE: 343 mg/dL — AB (ref 70–99)

## 2016-10-06 NOTE — Patient Instructions (Addendum)
Continue with glipizide 2.67m twice daily and metformin. Keep checking your blood sugars and keep a log. If you have symptoms of low blood sugar, check it and keep a log.    Hypoglycemia  Hypoglycemia is when the sugar (glucose) level in the blood is too low. Symptoms of low blood sugar may include:  Feeling:  Hungry.  Worried or nervous (anxious).  Sweaty and clammy.  Confused.  Dizzy.  Sleepy.  Sick to your stomach (nauseous).  Having:  A fast heartbeat.  A headache.  A change in your vision.  Jerky movements that you cannot control (seizure).  Nightmares.  Tingling or no feeling (numbness) around the mouth, lips, or tongue.  Having trouble with:  Talking.  Paying attention (concentrating).  Moving (coordination).  Sleeping.  Shaking.  Passing out (fainting).  Getting upset easily (irritability). Low blood sugar can happen to people who have diabetes and people who do not have diabetes. Low blood sugar can happen quickly, and it can be an emergency. Treating Low Blood Sugar  Low blood sugar is often treated by eating or drinking something sugary right away. If you can think clearly and swallow safely, follow the 15:15 rule:  Take 15 grams of a fast-acting carb (carbohydrate). Some fast-acting carbs are:  1 tube of glucose gel.  3 sugar tablets (glucose pills).  6-8 pieces of hard candy.  4 oz (120 mL) of fruit juice.  4 oz (120 mL) of regular (not diet) soda.  Check your blood sugar 15 minutes after you take the carb.  If your blood sugar is still at or below 70 mg/dL (3.9 mmol/L), take 15 grams of a carb again.  If your blood sugar does not go above 70 mg/dL (3.9 mmol/L) after 3 tries, get help right away.  After your blood sugar goes back to normal, eat a meal or a snack within 1 hour. Treating Very Low Blood Sugar  If your blood sugar is at or below 54 mg/dL (3 mmol/L), you have very low blood sugar (severe hypoglycemia). This is an  emergency. Do not wait to see if the symptoms will go away. Get medical help right away. Call your local emergency services (911 in the [place] Do not drive yourself to the hospital. If you have very low blood sugar and you cannot eat or drink, you may need a glucagon shot (injection). A family member or friend should learn how to check your blood sugar and how to give you a glucagon shot. Ask your doctor if you need to have a glucagon shot kit at home. Follow these instructions at home: General instructions  Avoid any diets that cause you to not eat enough food. Talk with your doctor before you start any new diet.  Take over-the-counter and prescription medicines only as told by your doctor.  Limit alcohol to no more than 1 drink per day for nonpregnant women and 2 drinks per day for men. One drink equals 12 oz of beer, 5 oz of wine, or 1 oz of hard liquor.  Keep all follow-up visits as told by your doctor. This is important. If You Have Diabetes:   Make sure you know the symptoms of low blood sugar.  Always keep a source of sugar with you, such as:  Sugar.  Sugar tablets.  Glucose gel.  Fruit juice.  Regular soda (not diet soda).  Milk.  Hard candy.  Honey.  Take your medicines as told.  Follow your exercise and meal plan.  Eat  on time. Do not skip meals.  Follow your sick day plan when you cannot eat or drink normally. Make this plan ahead of time with your doctor.  Check your blood sugar as often as told by your doctor. Always check before and after exercise.  Share your diabetes care plan with:  Your work or school.  People you live with.  Check your pee (urine) for ketones:  When you are sick.  As told by your doctor.  Carry a card or wear jewelry that says you have diabetes. If You Have Low Blood Sugar From Other Causes:   Check your blood sugar as often as told by your doctor.  Follow instructions from your doctor about what you cannot eat or  drink. Contact a doctor if:  You have trouble keeping your blood sugar in your target range.  You have low blood sugar often. Get help right away if:  You still have symptoms after you eat or drink something sugary.  Your blood sugar is at or below 54 mg/dL (3 mmol/L).  You have jerky movements that you cannot control.  You pass out. These symptoms may be an emergency. Do not wait to see if the symptoms will go away. Get medical help right away. Call your local emergency services (911 in the [place] Do not drive yourself to the hospital.  This information is not intended to replace advice given to you by your health care provider. Make sure you discuss any questions you have with your health care provider. Document Released: 02/02/2010 Document Revised: 04/15/2016 Document Reviewed: 12/12/2015 Elsevier Interactive Patient Education  2017 [employer].

## 2016-10-06 NOTE — Assessment & Plan Note (Signed)
Uncontrolled. Patient reports only checking CBGs twice since starting glipizide and both were in the 200s. CBG here 343 which she attributes to eating crackers before she came but notes this is typically not part of her diet. She is up to date on foot exam and vaccines. Discussed the importance of getting good glycemic control. Advised her to f/u with ophthalmology if possible for retintal screen. Patient can tell me symptoms of hypoglycemia and we discussed once again how to treat hypoglycemia. - check CBGs at least daily  - bring log to next appt - continue metformin and glipizide at current dose. - once we have more home CBGs, will most likely increase glipizide to 5mg  daily at next appt in 1 month.

## 2016-10-06 NOTE — Progress Notes (Signed)
    Subjective: CC: DM HPI: Patient is a 55 y.o. female with a past medical history of DM, HTN, and obesity presenting to clinic today for a follow up on DM.   Diabetes:  CBGs at home: has checked 2 blood sugars since starting glipizide 2-3 weeks ago. The numbers were 200 (fasting) and 212 (random) Taking medications: on metformin 1000mg  BID and glipizide 2.5mg  BID Side effects: none (has stable constipation) ROS: denies fever, chills, dizziness, LOC, polyuria, polydipsia, numbness or tingling in extremities or chest pain. Last eye exam: never had (states she can't afford, we referred but there is no ophtho for orange card, patient received resources/info in the mail).  Last foot exam: 08/24/16 Last A1c: 11.8 08/24/16 Nephropathy screen indicated?: no  Last flu, zoster and/or pneumovax: due for Tdap  Social History: never smoker   Health Maintenance: received Tdap today, will f/u for pap smear and annual exam  ROS: All other systems reviewed and are negative.  Past Medical History Patient Active Problem List   Diagnosis Date Noted  . Sinusitis 09/22/2016  . Abscess 08/26/2016  . Health care maintenance 04/22/2015  . Diabetes mellitus (HCC) 08/24/2007  . OBESITY 08/24/2007  . ANEMIA-NOS 08/24/2007  . HYPERTENSION 08/24/2007  . ALLERGIC RHINITIS 08/24/2007    Medications- reviewed and updated  Objective: Office vital signs reviewed. BP (!) 144/82   Pulse 81   Temp 98.3 F (36.8 C) (Oral)   Ht 4\' 11"  (1.499 m)   Wt 184 lb (83.5 kg)   SpO2 98%   BMI 37.16 kg/m    Physical Examination:  General: Awake, alert, well- nourished, NAD Cardio: RRR, no m/r/g noted. No pitting edema noted. Pulm: No increased WOB.  CTAB, without wheezes, rhonchi or crackles noted.   CBG 343  Assessment/Plan: Diabetes mellitus (HCC) Uncontrolled. Patient reports only checking CBGs twice since starting glipizide and both were in the 200s. CBG here 343 which she attributes to eating crackers  before she came but notes this is typically not part of her diet. She is up to date on foot exam and vaccines. Discussed the importance of getting good glycemic control. Advised her to f/u with ophthalmology if possible for retintal screen. Patient can tell me symptoms of hypoglycemia and we discussed once again how to treat hypoglycemia. - check CBGs at least daily  - bring log to next appt - continue metformin and glipizide at current dose. - once we have more home CBGs, will most likely increase glipizide to 5mg  daily at next appt in 1 month.      Orders Placed This Encounter  Procedures  . Tdap vaccine greater than or equal to 7yo IM  . POCT glucose (manual entry)    No orders of the defined types were placed in this encounter.   Joanna Puffrystal S. Jaymere Alen PGY-3, [facility]

## 2016-11-03 ENCOUNTER — Encounter: Payer: Self-pay | Admitting: Family Medicine

## 2016-11-03 ENCOUNTER — Ambulatory Visit (INDEPENDENT_AMBULATORY_CARE_PROVIDER_SITE_OTHER): Payer: No Typology Code available for payment source | Admitting: Family Medicine

## 2016-11-03 VITALS — BP 164/88 | HR 81 | Temp 97.9°F | Ht 59.0 in | Wt 182.8 lb

## 2016-11-03 DIAGNOSIS — E1165 Type 2 diabetes mellitus with hyperglycemia: Secondary | ICD-10-CM

## 2016-11-03 LAB — GLUCOSE, POCT (MANUAL RESULT ENTRY): POC Glucose: 278 mg/dl — AB (ref 70–99)

## 2016-11-03 MED ORDER — LISINOPRIL-HYDROCHLOROTHIAZIDE 20-25 MG PO TABS: 1.0000 | ORAL_TABLET | Freq: Every day | ORAL | 2 refills | Status: DC

## 2016-11-03 MED ORDER — GLIPIZIDE 5 MG PO TABS
5.0000 mg | ORAL_TABLET | Freq: Two times a day (BID) | ORAL | 2 refills | Status: DC
Start: 2016-11-03 — End: 2017-01-07

## 2016-11-03 NOTE — Assessment & Plan Note (Signed)
CBGs still elevated. No symptoms of hyperglycemia  - monofilament exam normal today. - up to date on vaccinations - will f/u in 2 months, at that time will check A1c  - dicussed lifestyle modifications.

## 2016-11-03 NOTE — Patient Instructions (Signed)
I have increased her dose of glipizide to 5 mg twice daily, this means you would take a full tablet in the morning and a full tablet at night. I have changed her lisinopril-hydrochlorothiazide to a higher dose, so usually have to take 1 tablet daily. Continue to take the metformin twice daily.  Speak with your pharmacist about how much they're charging you for your medications.  Try to get at least 150 minutes of exercise per week, that averages out to 30 minutes 5 times per week.  What your carbohydrate intake.

## 2016-11-03 NOTE — Assessment & Plan Note (Signed)
BP slightly elevated today, states its due to anxiety.  On previous checks it has been at goal per JNC 8 guidelines. - will continue lisinopril-HCTZ at current dose for now. - f/u in 2 months.

## 2016-11-03 NOTE — Progress Notes (Signed)
    Subjective: CC: DM HPI: Patient is a 55 y.o. female with a past medical history of DM, HTN, and obesity presenting to clinic today for a follow up on DM.   Diabetes:  CBGs at home: CBGs are 280-300 random CBGs (checks BID) Taking medications: on metformin 1000mg  BID and glipizide 2.5mg  BID Side effects: none (has stable constipation for which she takes a stool softner) ROS: denies fever, chills, dizziness, LOC, polyuria, polydipsia, numbness or tingling in extremities or chest pain. Last eye exam: never had (states she can't afford, we referred but there is no ophtho for orange card, patient received resources/info in the mail).  Last foot exam: 08/24/16 Last A1c: 11.8 08/24/16 Nephropathy screen indicated?: no  Last flu, zoster and/or pneumovax: up to date.  Hypertension Blood pressure at home: doesn't check Meds: Compliant with lisinopril-HCTZ  Side effects: none ROS: Denies headache, dizziness, visual changes, nausea, vomiting, chest pain, abdominal pain or shortness of breath.   Social History: never smoker   Health Maintenance: will f/u for pap smear and annual exam  ROS: All other systems reviewed and are negative.  Past Medical History Patient Active Problem List   Diagnosis Date Noted  . Sinusitis 09/22/2016  . Abscess 08/26/2016  . Health care maintenance 04/22/2015  . Diabetes mellitus (HCC) 08/24/2007  . OBESITY 08/24/2007  . ANEMIA-NOS 08/24/2007  . HYPERTENSION 08/24/2007  . ALLERGIC RHINITIS 08/24/2007    Medications- reviewed and updated  Objective: Office vital signs reviewed. BP (!) 164/88   Pulse 81   Temp 97.9 F (36.6 C) (Oral)   Ht 4\' 11"  (1.499 m)   Wt 182 lb 12.8 oz (82.9 kg)   BMI 36.92 kg/m    Physical Examination:  General: Awake, alert, well- nourished, NAD Cardio: RRR, no m/r/g noted. No pitting edema noted. Pulm: No increased WOB.  CTAB, without wheezes, rhonchi or crackles noted.  Extremities: 2+ DP pulses, no ulcerations.  Normal monofilament exam.   CBG 278  Assessment/Plan: HYPERTENSION BP slightly elevated today, states its due to anxiety.  On previous checks it has been at goal per JNC 8 guidelines. - will continue lisinopril-HCTZ at current dose for now. - f/u in 2 months.   Diabetes mellitus (HCC) CBGs still elevated. No symptoms of hyperglycemia  - monofilament exam normal today. - up to date on vaccinations - will f/u in 2 months, at that time will check A1c  - dicussed lifestyle modifications.     Orders Placed This Encounter  Procedures  . Glucose (CBG)    Meds ordered this encounter  Medications  . lisinopril-hydrochlorothiazide (PRINZIDE,ZESTORETIC) 20-25 MG tablet    Sig: Take 1 tablet by mouth daily.    Dispense:  30 tablet    Refill:  2  . glipiZIDE (GLUCOTROL) 5 MG tablet    Sig: Take 1 tablet (5 mg total) by mouth 2 (two) times daily before a meal.    Dispense:  60 tablet    Refill:  2    Joanna Puffrystal S. Dorsey PGY-3, [facility]

## 2016-11-25 ENCOUNTER — Telehealth: Payer: Self-pay | Admitting: Family Medicine

## 2016-11-25 MED ORDER — CEPHALEXIN 500 MG PO CAPS: 500.0000 mg | ORAL_CAPSULE | Freq: Two times a day (BID) | ORAL | 0 refills | Status: DC

## 2016-11-25 NOTE — Telephone Encounter (Signed)
Patient came by office stated she has UTI & ask if her pcp could call her in a med for this. Patient is going out of town tomorrow and ask if she may be called when this is done. [phone]

## 2016-11-25 NOTE — Telephone Encounter (Signed)
Returned patient called.  She notes burning with terminal urination.  The patient notes dysuria since Monday. Her urine is cloudy. She denies frequency or urgency. No fevers, chills, nausea, vomiting, abdominal pain, or back pain. She notes its been almost 2 years since her last UTI. She tried cranberry juice without improvement.  Unfortunately she cannot come to clinic to give a urine sample and she's going out of town tomorrow.   No allergies to antibiotics. Will call in Rx for keflex x 7 days. Discussed reasons to seek care, patient voiced understanding.  Lindsey Johnston S. Jakalyn Kratky, MD [facility] Resident  11/25/2016, 12:25 PM

## 2016-12-28 IMAGING — CR DG CHEST 2V
2 series · 2 of 2 positions shown · non-contrast
Comparison: None.

CLINICAL DATA: 54-year-old female with a history of nausea and
vomiting

EXAM:
CHEST - 2 VIEW

[w chest pa]
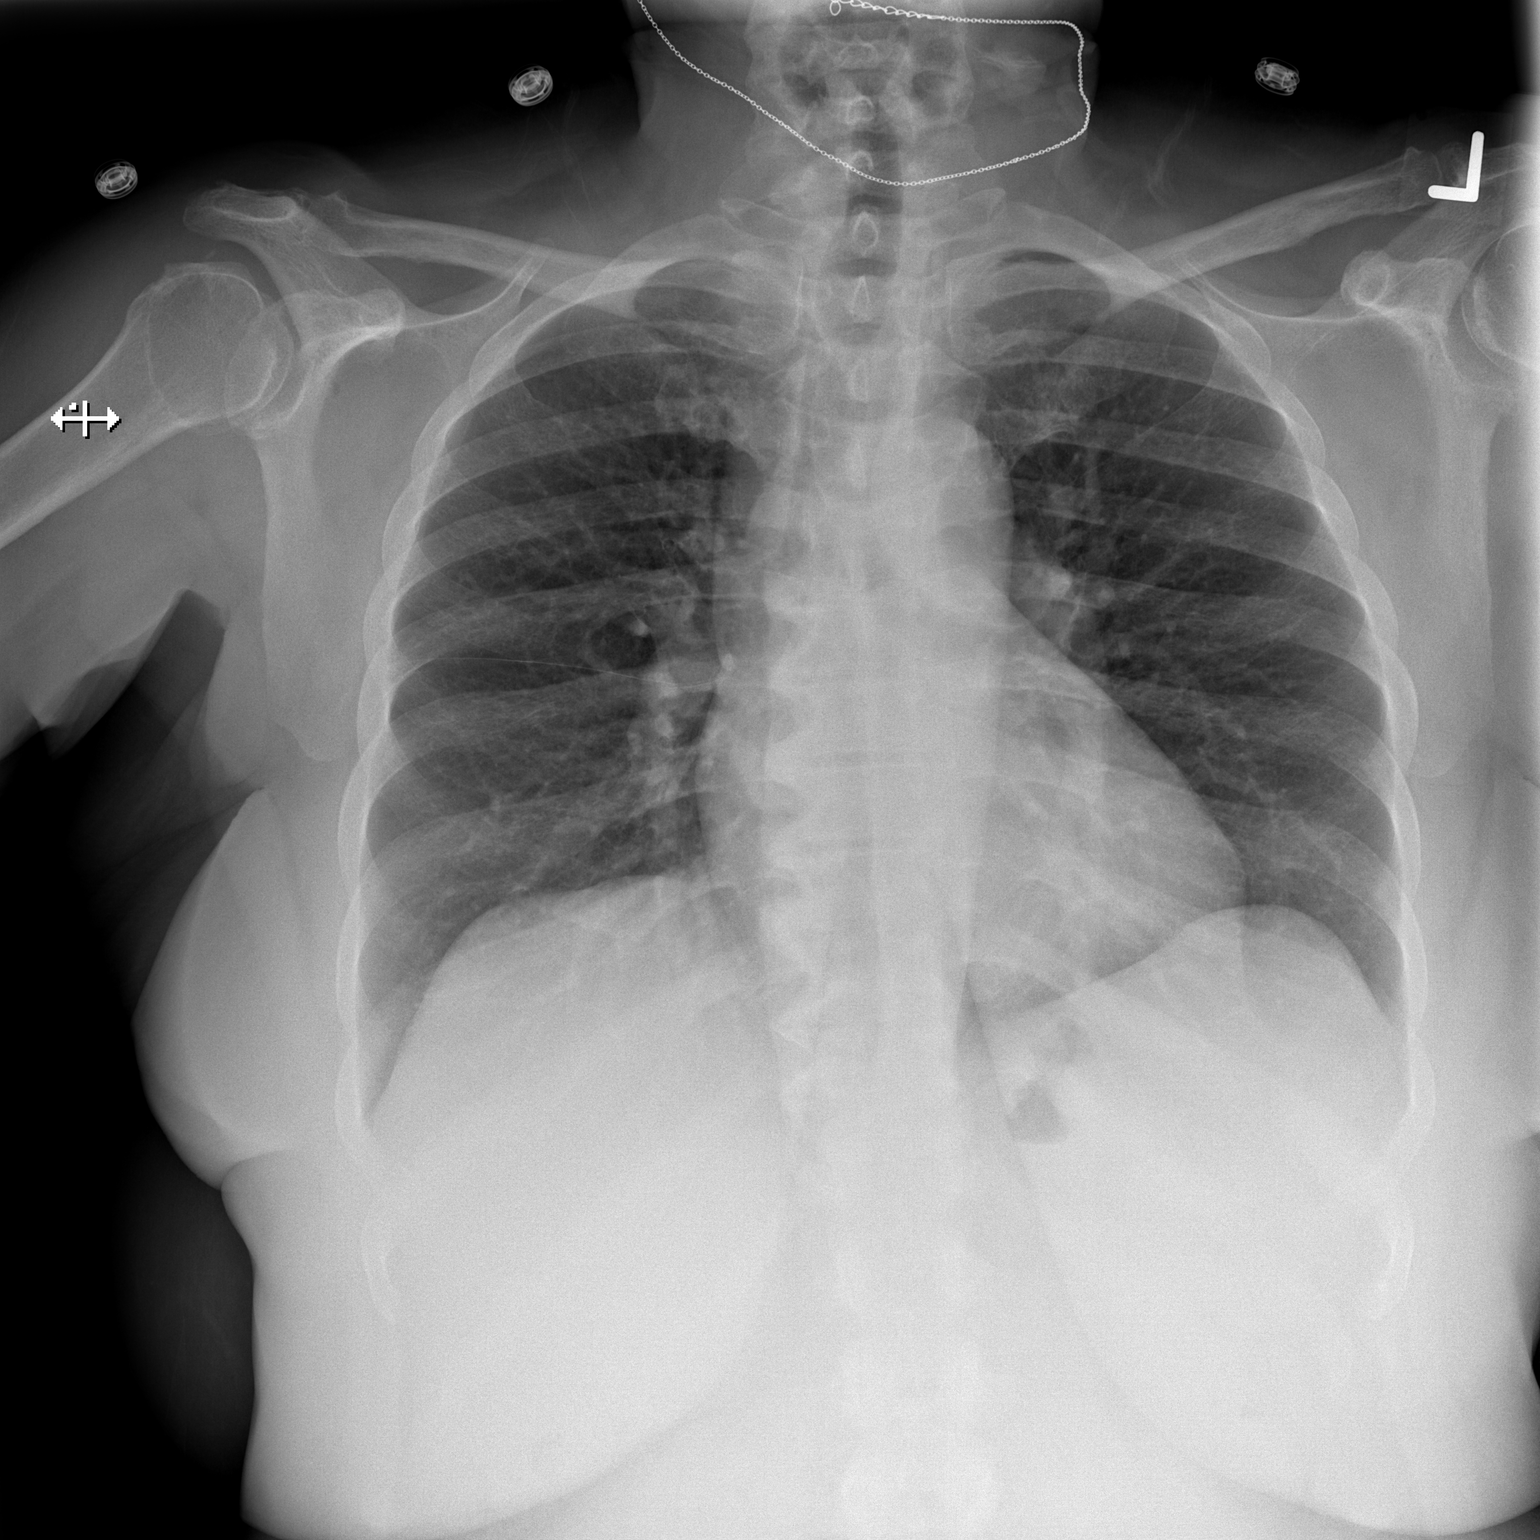

[w chest lat]
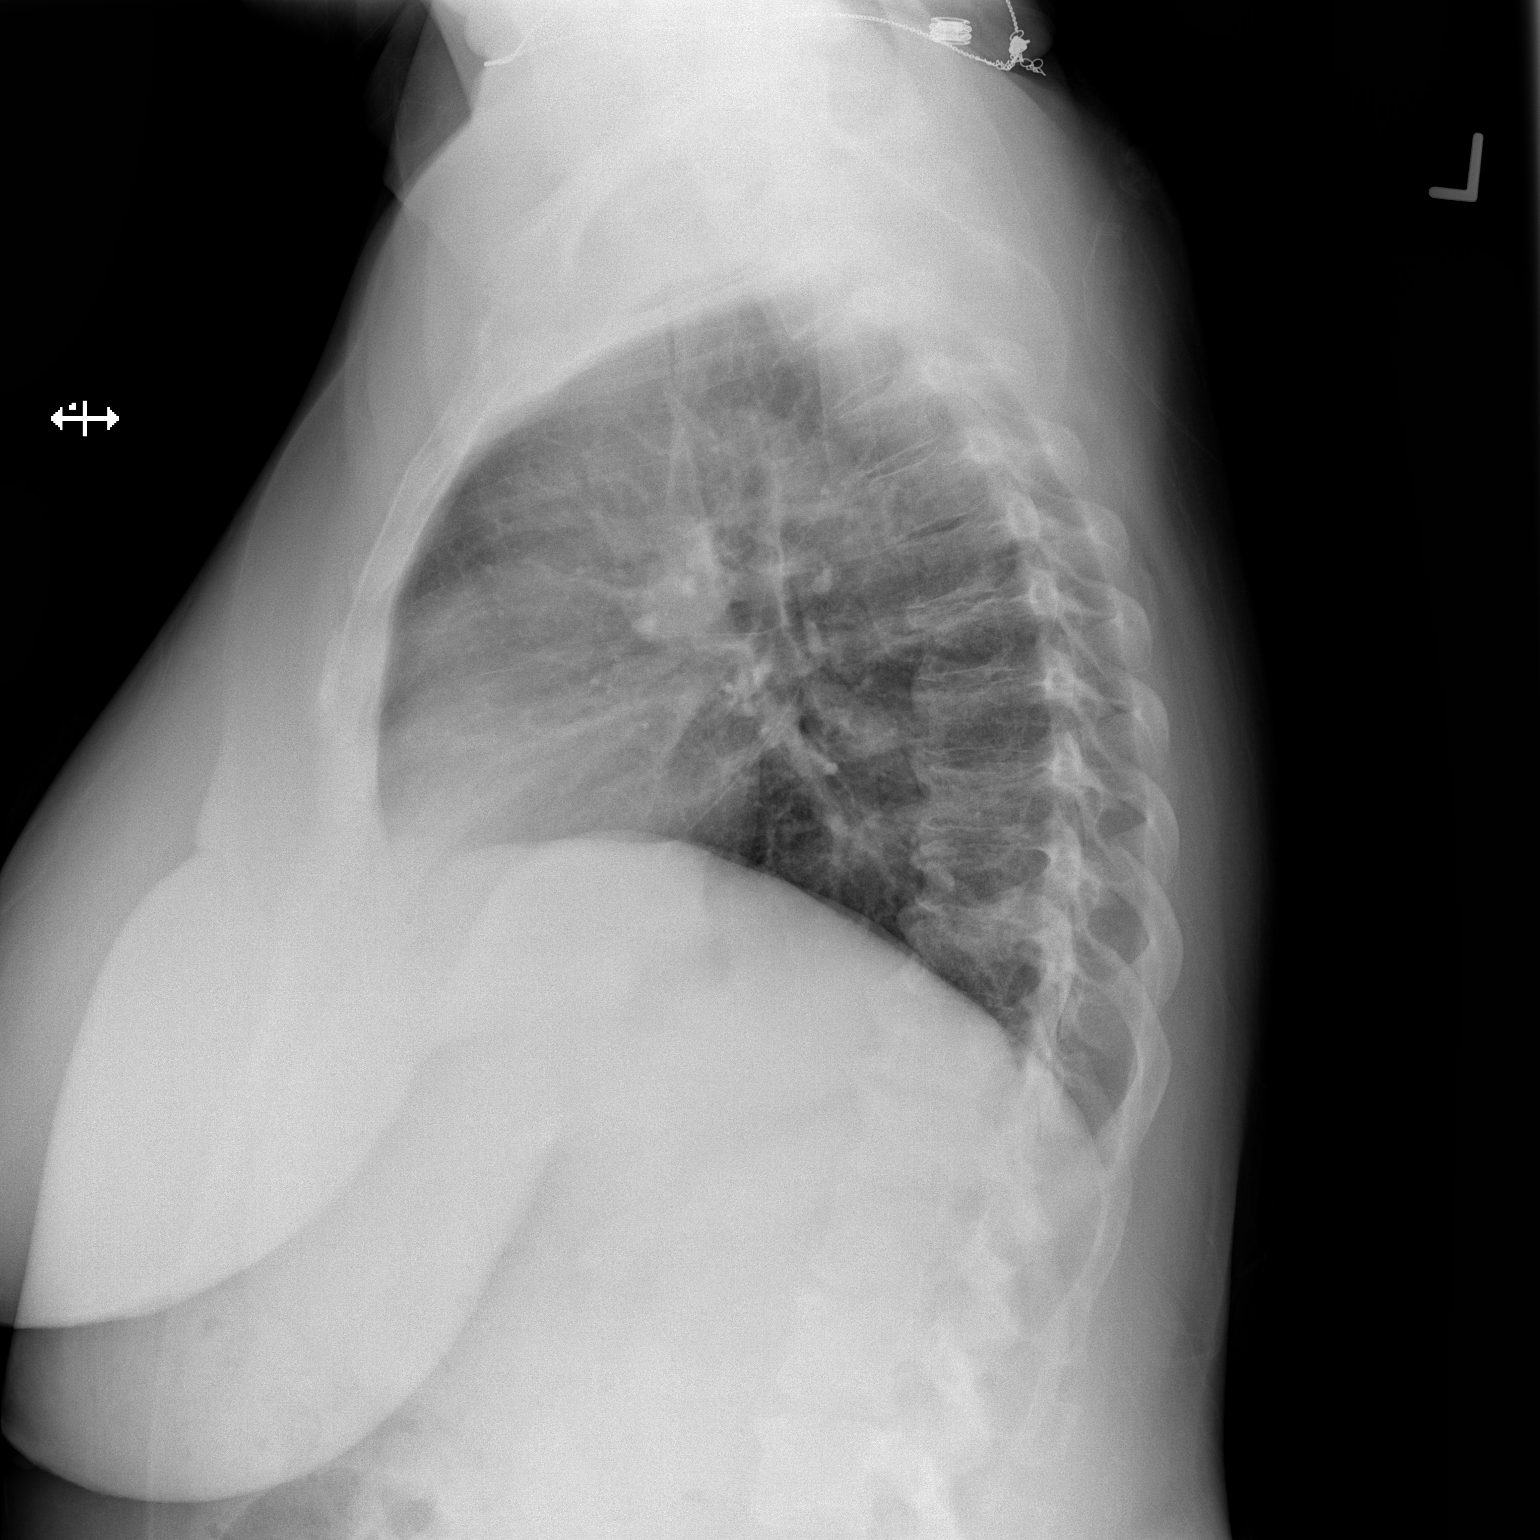

[2 of 2 positions shown; findings below may reference images not displayed]

FINDINGS: Cardiomediastinal silhouette projects within normal limits in size
and contour. No confluent airspace disease, pneumothorax, or pleural
effusion.

No displaced fracture.

Unremarkable appearance of the upper abdomen.
IMPRESSION: No radiographic evidence of acute cardiopulmonary disease.

## 2017-01-07 ENCOUNTER — Encounter: Payer: Self-pay | Admitting: Family Medicine

## 2017-01-07 ENCOUNTER — Ambulatory Visit (INDEPENDENT_AMBULATORY_CARE_PROVIDER_SITE_OTHER): Payer: BLUE CROSS/BLUE SHIELD | Admitting: Family Medicine

## 2017-01-07 VITALS — BP 150/86 | HR 87 | Temp 97.6°F | Ht 59.0 in | Wt 184.2 lb

## 2017-01-07 DIAGNOSIS — E1165 Type 2 diabetes mellitus with hyperglycemia: Secondary | ICD-10-CM

## 2017-01-07 DIAGNOSIS — J3489 Other specified disorders of nose and nasal sinuses: Secondary | ICD-10-CM | POA: Diagnosis not present

## 2017-01-07 DIAGNOSIS — R0981 Nasal congestion: Secondary | ICD-10-CM

## 2017-01-07 LAB — POCT GLYCOSYLATED HEMOGLOBIN (HGB A1C): Hemoglobin A1C: 10.1

## 2017-01-07 MED ORDER — GLIPIZIDE 5 MG PO TABS: ORAL_TABLET | ORAL | 2 refills | Status: DC

## 2017-01-07 NOTE — Patient Instructions (Signed)
It was a pleasure seeing you today in our clinic. Today we discussed your diabetes. Here is the treatment plan we have discussed and agreed upon together:   - I've increased your glipizide dosing to 10 mg, 2 tablets, in the morning. And 5 mg, 1 tablet, in the afternoon. - I would like to have you follow-up with Dr. Leonides Schanzorsey or myself in 3 months. At that time we will recheck your A1c. We may also discussed the possibility of starting Trulicity/Victoza in the future. - Keep up the great work!

## 2017-01-07 NOTE — Progress Notes (Signed)
56 yo female [facility] follow[facility]up on Diabetes management.  Diabetes mellitus type II  Currently on Glipizide 5 mg BID and Metformin 1000 mg BID. Denies any feelings or episodes of hypoglycemia. Denies chest pain, SOB, orthopnea, visual changes, numbness or pain in extremities.  Diet consists of eggs, Malaw[place]urkey sausage, fruits and vegetables for breakfast. Chicken for lunch and dinner. Water as beverage. Denies use of Soda. Exercise consists of walking 30 min per day at work and occasional jumping jacks. Patient works part time at [employer]. A1c 10.1 down from 11.7 in Oct 2017  Chronic sinusitis/anosmia/ageusia Patient reports chronic sinusitis symptoms for past 2 years. States that she first noticed decreased taste and smell 1.5 years ago. Denies history of trauma. Does not have complete loss of smell/taste. Able to smell strong perfume. States able to taste certain foods.  ROS: Gen: negative fatigue, weight loss, fever, chills Neuro: negative for tremors, weakness, unsteady gait, headache, blurry vision, numbness or tingling HEENT: Positive for nasal congestion, decreased taste and smell. Negative for epistaxis, throat pain, dental pain  Cv: Negative for chest pain, orthopnea, dyspnea  Pulm: Negative for cough, wheezing, stridor  Gi: Negative for hematochezia, melena, abdominal pain  Renal: Negative for hematuria, urgency, hesitancy Skin: Negative for foot ulcerations, rash   Physical Exam Gen: Pleasant female. NAD.  Neuro: Alert and interacting appropriately.  HEENT: Normocephalic. PERRLA. EOMI. TM grey without erythema, bulging or retraction. Nasal dorsum midline. No nasal discharge present. Oropharynx clear. No erythema or exudate. No thyromegaly. No LAD. Trachea midline.  ZO:XWRUEAVCV:Regular rate rhythm. S1 and S2 present. No murmurs.  Pulm: Lungs CTAB. Normal respiratory effort.  Abd: Soft and non-tender.  Ext: WWP. 2+ DP and PT pulses present. No edema in BLE. No ulcerations  present. Sensation to fine touch intact. Sensation to monofilament intact. Strength 5/5 in upper and lower extremities.  Plan:  Diabetes Mellitus II HbA1c 10.1 today down from 11.7 in 08/2016. Discussed adding additional medication or increasing her Glipizide dose to help decrease her A1c further. Options of injectables (victoza or trulicity) or Jardiance were explained and discussed with the patient. The patient currently has a part-time job and is concerned about affording the medication. Wanted to increase her Glipizide dosage today and wait to discuss adding a new medication when Dr. Leonides Schanzorsey is back from maternity leave. Will increase her Glipizide to 10 mg in AM and 5 mg in Pm.   Chronic sinusitis/ageusia/anosmia Recommended stopping all decongestant medications and to use Flonase daily. Patient will try for 10 days and If no improvements seen patients will return in 2 weeks and will consider ENT referral.

## 2017-01-08 DIAGNOSIS — J3489 Other specified disorders of nose and nasal sinuses: Secondary | ICD-10-CM

## 2017-01-08 DIAGNOSIS — R0981 Nasal congestion: Secondary | ICD-10-CM | POA: Insufficient documentation

## 2017-01-08 NOTE — Assessment & Plan Note (Signed)
Patient is here with complaints of persistent rhinorrhea. Symptoms have been going on for 1-2 years, per the patient. No red flag symptoms at this time. No history of trauma. No bleeding or purulence. - Encouraged persistent/religious use of Flonase twice a day, 2 weeks. - Patient is to follow-up in 2 weeks if rhinorrhea persists.  Next: If patient's chronic rhinorrhea does not improve with Flonase and would consider referral to ENT due to the reported duration of this issue.

## 2017-01-08 NOTE — Assessment & Plan Note (Signed)
HbA1c 10.1 today down from 11.7 in 08/2016. Patient has been compliant with her medications and has increased her activity level recently. Unfortunately, her A1c is still well above the comfortable level. I believe patient would be a good candidate for more aggressive management, Victoza/Trulicity. Jardiance would also be a good choice for this patient. We spent most of our time discussing these options with the patient. By the end of the visit patient wanted to think about these changes and possibly discuss them with her normal PCP. - Increase glipizide from 5 mg twice a day, to 10 mg in the morning and 5 mg at night. - Patient is to follow-up with Dr. Leonides Schanzorsey and discussed the possibility of initiating Victoza/Trulicity/Jardiance (likely, initially, in exchange for glipizide)

## 2017-02-13 ENCOUNTER — Other Ambulatory Visit: Payer: Self-pay | Admitting: Family Medicine

## 2017-04-15 ENCOUNTER — Other Ambulatory Visit: Payer: Self-pay | Admitting: Family Medicine

## 2017-04-15 DIAGNOSIS — E1165 Type 2 diabetes mellitus with hyperglycemia: Secondary | ICD-10-CM

## 2017-05-16 ENCOUNTER — Other Ambulatory Visit: Payer: Self-pay | Admitting: Family Medicine

## 2017-05-16 NOTE — Telephone Encounter (Signed)
Rx filled for 1 month supply.  Please have pt f/u in our clinic.  Thanks,  Joanna Puffrystal S. Rovena Hearld, MD [facility] Resident  05/16/2017, 4:28 PM

## 2017-05-17 NOTE — Telephone Encounter (Signed)
Patient informed, appointment scheduled for 7/13.

## 2017-06-02 ENCOUNTER — Encounter: Payer: Self-pay | Admitting: Family Medicine

## 2017-06-02 NOTE — Progress Notes (Signed)
Subjective:   Patient ID: Lindsey Johnston    DOB: Apr 10, 1961, 56 y.o. female   MRN: [id number]  CC: "Diabetes"  HPI: Lindsey Johnston is a 56 y.o. female who presents to clinic today for diabetes. Problems discussed today are as follows:  Diabetes: Patient states her diabetes is well controlled and she is aware she needs to make efforts to exercise and avoid excessive carb intake. Patient snacks regularly with chips and pork grinds. Drinks coffee without sugar and does not eat out much because she cannot afford it. She is taking her metformin and glipizide as instructed without missing doses. Glucose levels Average 200s fasting. Patient not recording postprandial. No lows. ROS: Denies nausea or vomiting, chest pain, dyspnea, abdominal pain.  Hypertension: Taking her lisinopril-HCTZ daily. Took dose this morning. States her blood pressure is usually controlled.  Health maintenance: Overdue for colonoscopy, Pap, mammogram. States her last colonoscopy was normal.  Complete ROS performed, see HPI for pertinent.  PMFSH: NIDDM, anemia, obesity. H/o TAHBSO, bladder surgery (adhesiolysis), carpal tunnel release. Mother DM, CKD, obesity, DVT, father DM, sister DM. Smoking status reviewed. Medications reviewed.  Objective:   BP 138/90   Pulse 77   Temp 98.3 F (36.8 C) (Oral)   Ht 4\' 11"  (1.499 m)   Wt 188 lb (85.3 kg)   SpO2 99%   BMI 37.97 kg/m  Vitals and nursing note reviewed.  General: obese, well nourished, well developed, in no acute distress with non-toxic appearance HEENT: normocephalic, atraumatic, moist mucous membranes Neck: supple, non-tender without lymphadenopathy CV: regular rate and rhythm without murmurs, rubs, or gallops, no lower extremity edema Lungs: clear to auscultation bilaterally with normal work of breathing Abdomen: soft, non-tender, non-distended, normoactive bowel sounds Skin: warm, dry, no rashes or lesions, cap refill < 2 seconds Extremities: warm and  well perfused, normal tone  Assessment & Plan:   Diabetes mellitus (HCC) Chronic. Uncontrolled. A1c 10.0. Currently on triple therapy without insulin use. Making lifestyle modifications. There is obvious requirement for insulin to control glucose levels. --Continue metformin 1000 mg twice daily, increase glipizide to 10 mg twice daily --Invokana, 100 mg daily --Ambulatory referral to ophthalmology for routine diabetic eye exam --RTC one month  Primary hypertension Chronic. Suboptimally controlled. Currently on lisinopril-HCTZ. --Continue lisinopril-HCTZ 20-25 mg daily --If remains elevated at next visit, would consider increasing or adding an additional antihypertensive  Routine health maintenance Overdue for colonoscopy, mammogram, Pap smear, and diabetic eye exam. --She received information to call and schedule screening mammogram and colonoscopy --We'll need to discuss Pap smear on another visit  Orders Placed This Encounter  Procedures  . Ambulatory referral to Ophthalmology    Referral Priority:   Routine    Referral Type:   Consultation    Referral Reason:   Specialty Services Required    Requested Specialty:   Ophthalmology    Number of Visits Requested:   1  . POCT glycosylated hemoglobin (Hb A1C)   Meds ordered this encounter  Medications  . canagliflozin (INVOKANA) 100 MG TABS tablet    Sig: Take 1 tablet (100 mg total) by mouth daily before breakfast.    Dispense:  90 tablet    Refill:  3  . glipiZIDE (GLUCOTROL) 10 MG tablet    Sig: Take 1 tablet in the morning and 1 tablet in the afternoon.    Dispense:  60 tablet    Refill:  3    Please consider 90 day supplies to promote better adherence  Durward Parcelavid McMullen, DO [facility], PGY-2 06/03/2017 3:11 PM

## 2017-06-03 ENCOUNTER — Ambulatory Visit (INDEPENDENT_AMBULATORY_CARE_PROVIDER_SITE_OTHER): Payer: BLUE CROSS/BLUE SHIELD | Admitting: Family Medicine

## 2017-06-03 ENCOUNTER — Encounter: Payer: Self-pay | Admitting: Family Medicine

## 2017-06-03 VITALS — BP 138/90 | HR 77 | Temp 98.3°F | Ht 59.0 in | Wt 188.0 lb

## 2017-06-03 DIAGNOSIS — E1165 Type 2 diabetes mellitus with hyperglycemia: Secondary | ICD-10-CM | POA: Diagnosis not present

## 2017-06-03 DIAGNOSIS — Z0000 Encounter for general adult medical examination without abnormal findings: Secondary | ICD-10-CM | POA: Diagnosis not present

## 2017-06-03 DIAGNOSIS — I10 Essential (primary) hypertension: Secondary | ICD-10-CM

## 2017-06-03 LAB — POCT GLYCOSYLATED HEMOGLOBIN (HGB A1C): Hemoglobin A1C: 10

## 2017-06-03 MED ORDER — CANAGLIFLOZIN 100 MG PO TABS: 100.0000 mg | ORAL_TABLET | Freq: Every day | ORAL | 3 refills | Status: DC

## 2017-06-03 MED ORDER — GLIPIZIDE 10 MG PO TABS: ORAL_TABLET | ORAL | 3 refills | Status: DC

## 2017-06-03 NOTE — Patient Instructions (Signed)
Thank you for coming in to see us today. Please see below to review our plan for today's visit.  1. We will start you on a new medication called Invokana. You'll take this once daily to help lower your A1c better controlling her sugars. Continue taking glipizide 10 mg twice a day and metformin. 2. We will recheck your blood pressure level during her next visit. Make sure you take your blood pressure medication the morning of your next visit. If it remains elevated, we'll need to increase her medication dose. 3. I have given you information on screening mammogram and colonoscopy. Please call and schedule these at a convenient time. I have sent in request for an ophthalmology follow-up for your diabetes. They will call you to notify of your appointment.  Return to clinic 1 month.  Please call the clinic at [phone] if your symptoms worsen or you have any concerns. It was my pleasure to see you. -- Durward Parcelavid McMullen, DO [facility], PGY-2  Canagliflozin oral tablets What is this medicine? CANAGLIFLOZIN (KAN a gli FLOE zin) helps to treat type 2 diabetes. It helps to control blood sugar. Treatment is combined with diet and exercise. This medicine may be used for other purposes; ask your health care provider or pharmacist if you have questions. COMMON BRAND NAME(S): Invokana What should I tell my health care provider before I take this medicine? They need to know if you have any of these conditions: -artery disease -dehydration -diabetic ketoacidosis -diet low in salt -eating less due to illness, surgery, dieting, or any other reason -foot sores -having surgery -high cholesterol -high levels of potassium in the blood -history of amputation -history of pancreatitis or pancreas problems -history of yeast infection of the penis or vagina -if you often drink alcohol -infections in the bladder, kidneys, or urinary tract -kidney disease -liver disease -low blood  pressure -nerve damage -on hemodialysis -problems urinating -type 1 diabetes -uncircumcised female -an unusual or allergic reaction to canagliflozin, other medicines, foods, dyes, or preservatives -pregnant or trying to get pregnant -breast-feeding How should I use this medicine? Take this medicine by mouth with a glass of water. Follow the directions on the prescription label. Take it before the first meal of the day. Take your dose at the same time each day. Do not take more often than directed. Do not stop taking except on your doctor's advice. A special MedGuide will be given to you by the pharmacist with each prescription and refill. Be sure to read this information carefully each time. Talk to your pediatrician regarding the use of this medicine in children. Special care may be needed. Overdosage: If you think you have taken too much of this medicine contact a poison control center or emergency room at once. NOTE: This medicine is only for you. Do not share this medicine with others. What if I miss a dose? If you miss a dose, take it as soon as you can. If it is almost time for your next dose, take only that dose. Do not take double or extra doses. What may interact with this medicine? Do not take this medicine with any of the following medications: -gatifloxacin This medicine may also interact with the following medications: -alcohol -certain medicines for blood pressure, heart disease -digoxin -diuretics -insulin -nateglinide -phenobarbital -phenytoin -repaglinide -rifampin -ritonavir -sulfonylureas like glimepiride, glipizide, glyburide This list may not describe all possible interactions. Give your health care provider a list of all the medicines, herbs, non-prescription drugs, or  dietary supplements you use. Also tell them if you smoke, drink alcohol, or use illegal drugs. Some items may interact with your medicine. What should I watch for while using this medicine? Visit  your doctor or health care professional for regular checks on your progress. This medicine can cause a serious condition in which there is too much acid in the blood. If you develop nausea, vomiting, stomach pain, unusual tiredness, or breathing problems, stop taking this medicine and call your doctor right away. If possible, use a ketone dipstick to check for ketones in your urine. A test called the HbA1C (A1C) will be monitored. This is a simple blood test. It measures your blood sugar control over the last 2 to 3 months. You will receive this test every 3 to 6 months. Learn how to check your blood sugar. Learn the symptoms of low and high blood sugar and how to manage them. Always carry a quick-source of sugar with you in case you have symptoms of low blood sugar. Examples include hard sugar candy or glucose tablets. Make sure others know that you can choke if you eat or drink when you develop serious symptoms of low blood sugar, such as seizures or unconsciousness. They must get medical help at once. Tell your doctor or health care professional if you have high blood sugar. You might need to change the dose of your medicine. If you are sick or exercising more than usual, you might need to change the dose of your medicine. Do not skip meals. Ask your doctor or health care professional if you should avoid alcohol. Many nonprescription cough and cold products contain sugar or alcohol. These can affect blood sugar. Wear a medical ID bracelet or chain, and carry a card that describes your disease and details of your medicine and dosage times. What side effects may I notice from receiving this medicine? Side effects that you should report to your doctor or health care professional as soon as possible: -allergic reactions like skin rash, itching or hives, swelling of the face, lips, or tongue -breathing problems -chest pain -dizziness -fast or irregular heartbeat -feeling faint or lightheaded,  falls -muscle weakness -nausea, vomiting, unusual stomach upset or pain -new pain or tenderness, change in skin color, sores or ulcers, or infection in legs or feet -signs and symptoms of low blood sugar such as feeling anxious, confusion, dizziness, increased hunger, unusually weak or tired, sweating, shakiness, cold, irritable, headache, blurred vision, fast heartbeat, loss of consciousness -signs and symptoms of a urinary tract infection, such as fever, chills, a burning feeling when urinating, blood in the urine, back pain -trouble passing urine or change in the amount of urine, including an urgent need to urinate more often, in larger amounts, or at night -penile discharge, itching, or pain in men -unusual tiredness -vaginal discharge, itching, or odor in women Side effects that usually do not require medical attention (report to your doctor or health care professional if they continue or are bothersome): -constipation -mild increase in urination -thirsty This list may not describe all possible side effects. Call your doctor for medical advice about side effects. You may report side effects to FDA at 1-800-FDA-1088. Where should I keep my medicine? Keep out of the reach of children. Store at room temperature between 20 and 25 degrees C (68 and 77 degrees F). Throw away any unused medicine after the expiration date. NOTE: This sheet is a summary. It may not cover all possible information. If you have questions about  this medicine, talk to your doctor, pharmacist, or health care provider.  2018 Elsevier/Gold Standard (2016-04-06 14:17:20)

## 2017-06-03 NOTE — Assessment & Plan Note (Addendum)
Chronic. Suboptimally controlled. Currently on lisinopril-HCTZ. --Continue lisinopril-HCTZ 20-25 mg daily --If remains elevated at next visit, would consider increasing or adding an additional antihypertensive

## 2017-06-03 NOTE — Assessment & Plan Note (Addendum)
Chronic. Uncontrolled. A1c 10.0. Currently on triple therapy without insulin use. Making lifestyle modifications. There is obvious requirement for insulin to control glucose levels. --Continue metformin 1000 mg twice daily, increase glipizide to 10 mg twice daily --Invokana, 100 mg daily --Ambulatory referral to ophthalmology for routine diabetic eye exam --RTC one month

## 2017-06-03 NOTE — Assessment & Plan Note (Addendum)
Overdue for colonoscopy, mammogram, Pap smear, and diabetic eye exam. --She received information to call and schedule screening mammogram and colonoscopy --We'll need to discuss Pap smear on another visit

## 2017-06-14 ENCOUNTER — Other Ambulatory Visit: Payer: Self-pay | Admitting: Family Medicine

## 2017-06-14 DIAGNOSIS — Z1231 Encounter for screening mammogram for malignant neoplasm of breast: Secondary | ICD-10-CM

## 2017-06-20 ENCOUNTER — Other Ambulatory Visit: Payer: Self-pay | Admitting: *Deleted

## 2017-06-21 MED ORDER — METFORMIN HCL 500 MG PO TABS: 1000.0000 mg | ORAL_TABLET | Freq: Two times a day (BID) | ORAL | 3 refills | Status: DC

## 2017-06-21 MED ORDER — LISINOPRIL-HYDROCHLOROTHIAZIDE 20-25 MG PO TABS: 1.0000 | ORAL_TABLET | Freq: Every day | ORAL | 3 refills | Status: DC

## 2017-06-27 ENCOUNTER — Ambulatory Visit
Admission: RE | Admit: 2017-06-27 | Discharge: 2017-06-27 | Disposition: A | Discharge status: Home / Self Care | Payer: BLUE CROSS/BLUE SHIELD | Source: Ambulatory Visit | Attending: Family Medicine | Admitting: Family Medicine

## 2017-06-27 DIAGNOSIS — Z1231 Encounter for screening mammogram for malignant neoplasm of breast: Secondary | ICD-10-CM

## 2017-06-28 ENCOUNTER — Other Ambulatory Visit: Payer: Self-pay | Admitting: Family Medicine

## 2017-06-28 DIAGNOSIS — R928 Other abnormal and inconclusive findings on diagnostic imaging of breast: Secondary | ICD-10-CM

## 2017-07-06 ENCOUNTER — Ambulatory Visit
Admission: RE | Admit: 2017-07-06 | Discharge: 2017-07-06 | Disposition: A | Discharge status: Home / Self Care | Payer: BLUE CROSS/BLUE SHIELD | Source: Ambulatory Visit | Attending: Family Medicine | Admitting: Family Medicine

## 2017-07-06 ENCOUNTER — Other Ambulatory Visit: Payer: Self-pay | Admitting: Family Medicine

## 2017-07-06 DIAGNOSIS — R928 Other abnormal and inconclusive findings on diagnostic imaging of breast: Secondary | ICD-10-CM

## 2017-07-06 DIAGNOSIS — R921 Mammographic calcification found on diagnostic imaging of breast: Secondary | ICD-10-CM

## 2017-07-08 ENCOUNTER — Encounter: Payer: Self-pay | Admitting: Internal Medicine

## 2017-07-12 ENCOUNTER — Ambulatory Visit
Admission: RE | Admit: 2017-07-12 | Discharge: 2017-07-12 | Disposition: A | Discharge status: Home / Self Care | Payer: BLUE CROSS/BLUE SHIELD | Source: Ambulatory Visit | Attending: Family Medicine | Admitting: Family Medicine

## 2017-07-12 DIAGNOSIS — R921 Mammographic calcification found on diagnostic imaging of breast: Secondary | ICD-10-CM

## 2017-07-13 ENCOUNTER — Telehealth: Payer: Self-pay | Admitting: Family Medicine

## 2017-07-13 NOTE — Telephone Encounter (Signed)
Called regarding breast biopsy without signs of malignancy. Pt was aware without further questions.  -- Durward Parcel, DO [facility], PGY-2

## 2017-08-24 ENCOUNTER — Ambulatory Visit (AMBULATORY_SURGERY_CENTER): Payer: Self-pay | Admitting: *Deleted

## 2017-08-24 VITALS — Ht 59.0 in | Wt 185.0 lb

## 2017-08-24 DIAGNOSIS — Z1211 Encounter for screening for malignant neoplasm of colon: Secondary | ICD-10-CM

## 2017-08-24 MED ORDER — NA SULFATE-K SULFATE-MG SULF 17.5-3.13-1.6 GM/177ML PO SOLN: ORAL | 0 refills | Status: DC

## 2017-08-24 NOTE — Progress Notes (Signed)
Patient denies any allergies to eggs or soy. Patient denies any problems with anesthesia/sedation. Patient denies any oxygen use at home. Patient denies taking any diet/weight loss medications or blood thinners. EMMI education assisgned to patient on colonoscopy, this was explained and instructions given to patient. 2 day prep given to patient due to her constipation.

## 2017-08-25 ENCOUNTER — Encounter: Payer: Self-pay | Admitting: Internal Medicine

## 2017-09-07 ENCOUNTER — Ambulatory Visit (AMBULATORY_SURGERY_CENTER): Payer: BLUE CROSS/BLUE SHIELD | Admitting: Internal Medicine

## 2017-09-07 ENCOUNTER — Encounter: Payer: Self-pay | Admitting: Internal Medicine

## 2017-09-07 VITALS — BP 141/79 | HR 73 | Temp 97.5°F | Resp 15 | Ht 59.0 in | Wt 188.0 lb

## 2017-09-07 DIAGNOSIS — Z1212 Encounter for screening for malignant neoplasm of rectum: Secondary | ICD-10-CM | POA: Diagnosis not present

## 2017-09-07 DIAGNOSIS — Z1211 Encounter for screening for malignant neoplasm of colon: Secondary | ICD-10-CM

## 2017-09-07 MED ORDER — SODIUM CHLORIDE 0.9 % IV SOLN: 500.0000 mL | INTRAVENOUS | Status: DC

## 2017-09-07 NOTE — Op Note (Addendum)
[facility] Patient Name: Lindsey Johnston Procedure Date: 09/07/2017 8:07 AM MRN: [id number] Endoscopist: Iva Boop , MD Age: 56 Referring MD:  Date of Birth: [birth year] Gender: Female Account #: [id number] Procedure:                Colonoscopy Indications:              Screening for colorectal malignant neoplasm Medicines:                Propofol per Anesthesia, Monitored Anesthesia Care Procedure:                Pre-Anesthesia Assessment:                           - Prior to the procedure, a History and Physical                            was performed, and patient medications and                            allergies were reviewed. The patient's tolerance of                            previous anesthesia was also reviewed. The risks                            and benefits of the procedure and the sedation                            options and risks were discussed with the patient.                            All questions were answered, and informed consent                            was obtained. Prior Anticoagulants: The patient has                            taken no previous anticoagulant or antiplatelet                            agents. ASA Grade Assessment: III - A patient with                            severe systemic disease. After reviewing the risks                            and benefits, the patient was deemed in                            satisfactory condition to undergo the procedure.                           After obtaining informed consent, the colonoscope  was passed under direct vision. Throughout the                            procedure, the patient's blood pressure, pulse, and                            oxygen saturations were monitored continuously. The                            Colonoscope was introduced through the anus and                            advanced to the the cecum, identified by                             appendiceal orifice and ileocecal valve. The                            quality of the bowel preparation was excellent. The                            colonoscopy was performed without difficulty. The                            patient tolerated the procedure well. The bowel                            preparation used was Miralax and SUPREP double. The                            ileocecal valve, appendiceal orifice, and rectum                            were photographed. Scope In: 8:11:39 AM Scope Out: 8:26:50 AM Scope Withdrawal Time: 0 hours 10 minutes 26 seconds  Total Procedure Duration: 0 hours 15 minutes 11 seconds  Findings:                 The perianal and digital rectal examinations were                            normal.                           The colon (entire examined portion) appeared normal.                           No additional abnormalities were found on                            retroflexion. Complications:            No immediate complications. Estimated blood loss:                            None. Estimated Blood Loss:  Estimated blood loss: none. Recommendation:           - Repeat colonoscopy in 10 years for screening                            purposes.                           - Patient has a contact number available for                            emergencies. The signs and symptoms of potential                            delayed complications were discussed with the                            patient. Return to normal activities tomorrow.                            Written discharge instructions were provided to the                            patient.                           - Resume previous diet.                           - Continue present medications.                           - Add daily benefiber vs MiraLax                           F/U me prn if that not helpful for chronic                            constipation Iva Booparl E Lindsey Hockenbury,  MD 09/07/2017 8:32:22 AM This report has been signed electronically.

## 2017-09-07 NOTE — Patient Instructions (Addendum)
   No polyps or cancer seen - NORMAL!  Next routine colonoscopy or other screening test in 10 years - 2028  Daily Benefiber or Miralax. If constipation does not improve, please come back and see me in the office.  I appreciate the opportunity to care for you. Iva Booparl E. Dominique Ressel, MD, FACG   YOU HAD AN ENDOSCOPIC PROCEDURE TODAY AT THE [facility]:   Refer to the procedure report that was given to you for any specific questions about what was found during the examination.  If the procedure report does not answer your questions, please call your gastroenterologist to clarify.  If you requested that your care partner not be given the details of your procedure findings, then the procedure report has been included in a sealed envelope for you to review at your convenience later.  YOU SHOULD EXPECT: Some feelings of bloating in the abdomen. Passage of more gas than usual.  Walking can help get rid of the air that was put into your GI tract during the procedure and reduce the bloating. If you had a lower endoscopy (such as a colonoscopy or flexible sigmoidoscopy) you may notice spotting of blood in your stool or on the toilet paper. If you underwent a bowel prep for your procedure, you may not have a normal bowel movement for a few days.  Please Note:  You might notice some irritation and congestion in your nose or some drainage.  This is from the oxygen used during your procedure.  There is no need for concern and it should clear up in a day or so.  SYMPTOMS TO REPORT IMMEDIATELY:   Following lower endoscopy (colonoscopy or flexible sigmoidoscopy):  Excessive amounts of blood in the stool  Significant tenderness or worsening of abdominal pains  Swelling of the abdomen that is new, acute  Fever of 100F or higher  For urgent or emergent issues, a gastroenterologist can be reached at any hour by calling [phone].   DIET:  We do recommend a small meal at first, but then you  may proceed to your regular diet.  Drink plenty of fluids but you should avoid alcoholic beverages for 24 hours.  ACTIVITY:  You should plan to take it easy for the rest of today and you should NOT DRIVE or use heavy machinery until tomorrow (because of the sedation medicines used during the test).    FOLLOW UP: Our staff will call the number listed on your records the next business day following your procedure to check on you and address any questions or concerns that you may have regarding the information given to you following your procedure. If we do not reach you, we will leave a message.  However, if you are feeling well and you are not experiencing any problems, there is no need to return our call.  We will assume that you have returned to your regular daily activities without incident.  If any biopsies were taken you will be contacted by phone or by letter within the next 1-3 weeks.  Please call us at [phone] if you have not heard about the biopsies in 3 weeks.    SIGNATURES/CONFIDENTIALITY: You and/or your care partner have signed paperwork which will be entered into your electronic medical record.  These signatures attest to the fact that that the information above on your After Visit Summary has been reviewed and is understood.  Full responsibility of the confidentiality of this discharge information lies with you and/or your care-partner.

## 2017-09-07 NOTE — Progress Notes (Signed)
A and O x3. Report to RN. Tolerated MAC anesthesia well.

## 2017-09-08 ENCOUNTER — Telehealth: Payer: Self-pay

## 2017-09-08 NOTE — Telephone Encounter (Signed)
Left message

## 2017-09-08 NOTE — Telephone Encounter (Signed)
   Follow up Call-  Call back number 09/07/2017  Post procedure Call Back phone  # [phone]  Permission to leave phone message Yes  Some recent data might be hidden     Patient questions:  Do you have a fever, pain , or abdominal swelling? No. Pain Score  0 *  Have you tolerated food without any problems? Yes.    Have you been able to return to your normal activities? Yes.    Do you have any questions about your discharge instructions: Diet   No. Medications  No. Follow up visit  No.  Do you have questions or concerns about your Care? No.  Actions: * If pain score is 4 or above: No action needed, pain <4.

## 2017-10-19 ENCOUNTER — Other Ambulatory Visit: Payer: Self-pay | Admitting: Family Medicine

## 2017-10-19 DIAGNOSIS — E1165 Type 2 diabetes mellitus with hyperglycemia: Secondary | ICD-10-CM

## 2017-10-19 MED ORDER — GLIPIZIDE 10 MG PO TABS: ORAL_TABLET | ORAL | 3 refills | Status: DC

## 2017-10-27 ENCOUNTER — Other Ambulatory Visit: Payer: Self-pay | Admitting: Family Medicine

## 2017-10-27 DIAGNOSIS — E1165 Type 2 diabetes mellitus with hyperglycemia: Secondary | ICD-10-CM

## 2017-10-27 MED ORDER — METFORMIN HCL 500 MG PO TABS: 1000.0000 mg | ORAL_TABLET | Freq: Two times a day (BID) | ORAL | 5 refills | Status: DC

## 2018-03-17 ENCOUNTER — Other Ambulatory Visit: Payer: Self-pay

## 2018-03-17 ENCOUNTER — Ambulatory Visit: Payer: BLUE CROSS/BLUE SHIELD | Admitting: Internal Medicine

## 2018-03-17 ENCOUNTER — Encounter: Payer: Self-pay | Admitting: Internal Medicine

## 2018-03-17 VITALS — BP 138/82 | HR 78 | Temp 98.6°F | Ht 59.0 in | Wt 176.8 lb

## 2018-03-17 DIAGNOSIS — G5603 Carpal tunnel syndrome, bilateral upper limbs: Secondary | ICD-10-CM | POA: Diagnosis not present

## 2018-03-17 DIAGNOSIS — E1165 Type 2 diabetes mellitus with hyperglycemia: Secondary | ICD-10-CM | POA: Diagnosis not present

## 2018-03-17 DIAGNOSIS — M25531 Pain in right wrist: Secondary | ICD-10-CM | POA: Insufficient documentation

## 2018-03-17 DIAGNOSIS — M25532 Pain in left wrist: Secondary | ICD-10-CM

## 2018-03-17 LAB — POCT GLYCOSYLATED HEMOGLOBIN (HGB A1C): HEMOGLOBIN A1C: 6.7

## 2018-03-17 MED ORDER — PREDNISONE 50 MG PO TABS: 50.0000 mg | ORAL_TABLET | Freq: Every day | ORAL | 0 refills | Status: DC

## 2018-03-17 NOTE — Patient Instructions (Addendum)
We need today.  I want you to continue using the splint especially at night but you can also use it during the day.  I am getting every 5 days of prednisone to see if that helps with inflammation I have placed a referral for you to see sports medicine and they will be able to do hand injections

## 2018-03-17 NOTE — Assessment & Plan Note (Signed)
Possible carpal tunnel syndrome and left hand, given patient with third fourth and fifth digit pain and numbness in right hand possible ulnar nerve entrapment-she does have diabetes which is now significantly better controlled -given this is more associated with certain movements most likely carpal tunnel rather than diabetic neuropathy -Patient advised to use hand splints specifically at night and can use them during the day as well -Will provide 5 days of prednisone -Follow-up with sports medicine for possible carpal tunnel prednisone shots -Follow-up as needed

## 2018-03-17 NOTE — Progress Notes (Signed)
   [facility] Lindsey CharsAsiyah Shawnee Gambone, MD Phone: [phone]  Reason For Visit: SDA for Hand Pain   # Hand Pain  - Patient has had pain in both hand since February. Notes the left hand is worse than the right. She works in [employer], [employer] and does a lot of repetative movements, she believes this has exacerbated the pain. Patient states she has numbness and shooting pains that go into the thumb and 1st digit on her left hand. She feels that her 3rd, 4ht and 5th digit on the righ hand have swelling in the DIP, PIP joints, they also have shooting pains at times. Has had Hand surgery with carpal tunnel release (at least 10 years ago).   Past Medical History Reviewed problem list.  Medications- reviewed and updated No additions to family history Social history- patient is a non- smoker  Objective: BP 138/82   Pulse 78   Temp 98.6 F (37 C) (Oral)   Ht 4\' 11"  (1.499 m)   Wt 176 lb 12.8 oz (80.2 kg)   SpO2 98%   BMI 35.71 kg/m  Gen: NAD, alert, cooperative with exam MSK: Slight hyperthenar muscle wasting noted on the right compared to the left, no notable swelling that I noticed on examination though per patient positive, erythema, positive phineal test, normal range of motion in fingers, 2+ radial pulses Skin: dry, intact, no rashes or lesions    Assessment/Plan: See problem based a/p  Carpal tunnel syndrome Possible carpal tunnel syndrome and left hand, given patient with third fourth and fifth digit pain and numbness in right hand possible ulnar nerve entrapment-she does have diabetes which is now significantly better controlled -given this is more associated with certain movements most likely carpal tunnel rather than diabetic neuropathy -Patient advised to use hand splints specifically at night and can use them during the day as well -Will provide 5 days of prednisone -Follow-up with sports medicine for possible carpal tunnel prednisone shots -Follow-up as  needed

## 2018-03-17 NOTE — Progress Notes (Signed)
poct

## 2018-03-31 ENCOUNTER — Ambulatory Visit: Payer: BLUE CROSS/BLUE SHIELD | Admitting: Family Medicine

## 2018-03-31 ENCOUNTER — Encounter: Payer: Self-pay | Admitting: Family Medicine

## 2018-03-31 DIAGNOSIS — M25531 Pain in right wrist: Secondary | ICD-10-CM

## 2018-03-31 DIAGNOSIS — M25532 Pain in left wrist: Secondary | ICD-10-CM

## 2018-03-31 MED ORDER — PREDNISONE 10 MG PO TABS: ORAL_TABLET | ORAL | 0 refills | Status: DC

## 2018-03-31 NOTE — Patient Instructions (Signed)
You have carpal tunnel syndrome. I'm also concerned about you having trigger fingers of the left index, more mild triggering if your ring and middle fingers of right hand. Wear the wrist braces at nighttime and as often as possible during the day Take extended prednisone dose pack x 12 days.  Don't take aleve or ibuprofen with this. Ok to take tylenol if needed though. Corticosteroid injection is a consideration to help with pain and inflammation - call me if you want to go ahead with these. If not improving, can consider nerve conduction studies to assess severity. Follow up with me in 1 month but call me sooner if needed.

## 2018-04-01 ENCOUNTER — Encounter: Payer: Self-pay | Admitting: Family Medicine

## 2018-04-01 NOTE — Progress Notes (Signed)
PCP: Wendee Beavers, DO Consultation requested by: Dr. Cathlean Cower  Subjective:   HPI: Patient is a 57 y.o. female here for bilateral hand/wrist pain.  Patient reports for about 5 months she has had fairly severe right > left wrist pain. Ain 10/10 and sharp on left, 7/10 on right. She had surgery on right carpal tunnel with release of thumb and pinky digits (describes trigger fingers) about 10 years ago. Was told she wouldn't return to 100% She is having trouble bending her left index, right middle and ring fingers. Difficulty at work, with lifting and trying to turn things. Tried support bands of wrist/hand, prednisone dose packs. Prednisone helped only temporarily. No skin changes. Numbness into thumb, index fingers mainly on the left.  Past Medical History:  Diagnosis Date  . Allergic rhinitis   . Anemia   . Asthma   . Carpal tunnel syndrome   . Constipation   . DM (diabetes mellitus) (HCC)   . Duodenitis 02/15/2006  . Dysphagia 02/15/2006   maloney dilation  . Erosive gastritis 02/15/2006  . Gastritis 02/15/2006   proximal non-erosive  . Hemorrhoids    internal and external  . Hypertension   . Obese     Current Outpatient Medications on File Prior to Visit  Medication Sig Dispense Refill  . Ascorbic Acid (VITAMIN C PO) Take 1 tablet by mouth daily.    Marland Kitchen aspirin 81 MG tablet Take 81 mg by mouth daily as needed for pain (pain).    . Blood Glucose Monitoring Suppl (RELION PRIME MONITOR) DEVI Use to check blood sugar once daily 1 Device 0  . canagliflozin (INVOKANA) 100 MG TABS tablet Take 1 tablet (100 mg total) by mouth daily before breakfast. 90 tablet 3  . diphenhydrAMINE (BENADRYL) 25 mg capsule Take 25 mg by mouth every 6 (six) hours as needed.    . docusate sodium (COLACE) 100 MG capsule Take 200 mg by mouth daily as needed for mild constipation.    Marland Kitchen FIBER PO Take 2 tablets by mouth daily.    . fluticasone (FLONASE) 50 MCG/ACT nasal spray Place 2 sprays into both  nostrils daily. 16 g 1  . glipiZIDE (GLUCOTROL) 10 MG tablet Take 1 tablet in the morning and 1 tablet in the afternoon. 180 tablet 3  . glucose blood (RELION PRIME TEST) test strip Use as instructed 50 each 1  . ibuprofen (ADVIL,MOTRIN) 200 MG tablet Take 600 mg by mouth every 6 (six) hours as needed for moderate pain (pain).    Marland Kitchen lisinopril-hydrochlorothiazide (PRINZIDE,ZESTORETIC) 20-25 MG tablet Take 1 tablet by mouth daily. 90 tablet 3  . metFORMIN (GLUCOPHAGE) 500 MG tablet Take 2 tablets (1,000 mg total) by mouth 2 (two) times daily with a meal. 240 tablet 5  . Multiple Vitamins-Minerals (CENTRUM ADULTS PO) Take 1 tablet by mouth daily.    . Probiotic Product (PROBIOTIC DAILY PO) Take 1 tablet by mouth daily as needed.    . Sodium Phosphates (PHOSPHATE LAXATIVE PO) Take 1 Dose by mouth as needed.     No current facility-administered medications on file prior to visit.     Past Surgical History:  Procedure Laterality Date  . ABDOMINAL HYSTERECTOMY    . BLADDER SURGERY     growth in bladder/sounds like adhesiolysis  . CARPAL TUNNEL RELEASE    . COLONOSCOPY  2007  . MYOMECTOMY    . OOPHORECTOMY      No Known Allergies  Social History   Socioeconomic History  . Marital status:  Legally Separated    Spouse name: Not on file  . Number of children: Not on file  . Years of education: Not on file  . Highest education level: Not on file  Occupational History  . Not on file  Social Needs  . Financial resource strain: Not on file  . Food insecurity:    Worry: Not on file    Inability: Not on file  . Transportation needs:    Medical: Not on file    Non-medical: Not on file  Tobacco Use  . Smoking status: Never Smoker  . Smokeless tobacco: Never Used  Substance and Sexual Activity  . Alcohol use: Yes    Comment: occ.  . Drug use: No  . Sexual activity: Not on file  Lifestyle  . Physical activity:    Days per week: Not on file    Minutes per session: Not on file  .  Stress: Not on file  Relationships  . Social connections:    Talks on phone: Not on file    Gets together: Not on file    Attends religious service: Not on file    Active member of club or organization: Not on file    Attends meetings of clubs or organizations: Not on file    Relationship status: Not on file  . Intimate partner violence:    Fear of current or ex partner: Not on file    Emotionally abused: Not on file    Physically abused: Not on file    Forced sexual activity: Not on file  Other Topics Concern  . Not on file  Social History Narrative  . Not on file    Family History  Problem Relation Age of Onset  . Diabetes Mother   . Kidney failure Mother   . Obesity Mother   . Deep vein thrombosis Mother   . Diabetes Father   . Diabetes Sister   . Hypertension Maternal Grandmother   . Hyperlipidemia Maternal Grandmother   . Colon cancer Neg Hx     BP (!) 144/86   Pulse 73   Ht  (1.499 m)   Wt 178 lb (80.7 kg)   BMI 35.95 kg/m   Review of Systems: See HPI above.     Objective:  Physical Exam:  Gen: NAD, comfortable in exam room  Left hand/wrist: No deformity, swelling, bruising, atrophy. TTP carpal tunnel, over A1 pulley of 2nd digit.  No other tenderness. FROM wrist, digits except limitation of flexion to 30 degrees with 2nd digit PIP.  Full motion DIP, MCP joints.  5/5 strength all motions. Collateral ligaments intact of index finger. Sensation diminished 1st, 2nd digits. Otherwise NVI distally.  Right hand/wrist: No deformity, swelling, bruising, atrophy. TTP minimally at carpal tunnel.  TTP over flexor tendons including A1 pulley of 3rd, 4th digits.   FROM wrist and digits with 5/5 strength. Sensation intact to light touch. NVI distally.   Assessment & Plan:  1. Bilateral hand/wrist pain - combination of issues but primarily carpal tunnel syndrome bilaterally.  Trigger fingers also primarily of left index finger but also of right ring and  middle fingers.  We discussed options - she is going to use wrist braces as often as possible, take extended prednisone dose pack.  Will consider injections (advised would do carpal tunnel injections bilaterally as next step) if not improving.  F/u in 1 month.

## 2018-04-01 NOTE — Assessment & Plan Note (Signed)
combination of issues but primarily carpal tunnel syndrome bilaterally.  Trigger fingers also primarily of left index finger but also of right ring and middle fingers.  We discussed options - she is going to use wrist braces as often as possible, take extended prednisone dose pack.  Will consider injections (advised would do carpal tunnel injections bilaterally as next step) if not improving.  F/u in 1 month.

## 2018-04-21 ENCOUNTER — Ambulatory Visit: Payer: BLUE CROSS/BLUE SHIELD | Admitting: Family Medicine

## 2018-04-21 NOTE — Progress Notes (Deleted)
   Subjective   Patient ID: Lindsey Johnston    DOB: 1961-09-27, 57 y.o. female   MRN: [id number]  CC: "***"  HPI: Lindsey Johnston is a 57 y.o. female who presents to clinic today for the following:  ***: ***  ***seen by me on 05/2017.  Diabetes uncontrolled, A1c 10.  Instructed to continue metformin 1000 twice daily and increase glipizide to 10 mg twice daily and continue Invokana 100 mg daily.  Given ambulatory referral to ophthalmology for diabetic eye exam.  Patient has been scheduled to see Dr. Mack Hook on  06/14/2017  at  2:20PM . Patient is aware of appointment. Instructed to follow-up in 1 month.  BP suboptimally controlled.  Received colonoscopy 09/07/2017 without findings.  Needs foot exam for diabetes.  Schedule appointment for Pap smear?  ROS: see HPI for pertinent.  PMFSH: NIDDM, anemia, obesity. H/o TAHBSO, bladder surgery (adhesiolysis), carpal tunnel release. Mother DM, CKD, obesity, DVT, father DM, sister DM. Smoking status reviewed. Medications reviewed.  Objective   There were no vitals taken for this visit. Vitals and nursing note reviewed.  General: well nourished, well developed, NAD with non-toxic appearance HEENT: normocephalic, atraumatic, moist mucous membranes Neck: supple, non-tender without lymphadenopathy Cardiovascular: regular rate and rhythm without murmurs, rubs, or gallops Lungs: clear to auscultation bilaterally with normal work of breathing Abdomen: soft, non-tender, non-distended, normoactive bowel sounds Skin: warm, dry, no rashes or lesions, cap refill < 2 seconds Extremities: warm and well perfused, normal tone, no edema  Assessment & Plan   No problem-specific Assessment & Plan notes found for this encounter.  No orders of the defined types were placed in this encounter.  No orders of the defined types were placed in this encounter.   Durward Parcel, DO [facility], PGY-2 04/21/2018, 9:08 AM

## 2018-04-28 ENCOUNTER — Ambulatory Visit: Payer: BLUE CROSS/BLUE SHIELD | Admitting: Family Medicine

## 2018-04-28 ENCOUNTER — Encounter: Payer: Self-pay | Admitting: Family Medicine

## 2018-04-28 DIAGNOSIS — M25532 Pain in left wrist: Secondary | ICD-10-CM

## 2018-04-28 DIAGNOSIS — M25531 Pain in right wrist: Secondary | ICD-10-CM

## 2018-04-28 MED ORDER — METHYLPREDNISOLONE ACETATE 40 MG/ML IJ SUSP
40.0000 mg | Freq: Once | INTRAMUSCULAR | Status: AC
  Administered 2018-04-28: 40 mg via INTRA_ARTICULAR

## 2018-04-28 NOTE — Progress Notes (Signed)
PCP: Wendee BeaversMcMullen, David J, DO Consultation requested by: Dr. Cathlean CowerMikell  Subjective:   HPI: Patient is a [age] y.o. female here for bilateral hand/wrist pain.  5/10: Patient reports for about 5 months she has had fairly severe right > left wrist pain. Ain 10/10 and sharp on left, 7/10 on right. She had surgery on right carpal tunnel with release of thumb and pinky digits (describes trigger fingers) about 10 years ago. Was told she wouldn't return to 100% She is having trouble bending her left index, right middle and ring fingers. Difficulty at work, with lifting and trying to turn things. Tried support bands of wrist/hand, prednisone dose packs. Prednisone helped only temporarily. No skin changes. Numbness into thumb, index fingers mainly on the left.  6/7: Patient returns feeling no better than last visit. No changes with extended course of prednisone, wearing wrist braces regularly. Pain is 10/10 on left, 9/10 on right and sharp. She's getting catching of right ring, sometimes middle finger, left 2nd-4th digits. Numbness into thumb and index fingers bilaterally. No skin changes. Past Medical History:  Diagnosis Date  . Allergic rhinitis   . Anemia   . Asthma   . Carpal tunnel syndrome   . Constipation   . DM (diabetes mellitus) (HCC)   . Duodenitis 02/15/2006  . Dysphagia 02/15/2006   maloney dilation  . Erosive gastritis 02/15/2006  . Gastritis 02/15/2006   proximal non-erosive  . Hemorrhoids    internal and external  . Hypertension   . Obese     Current Outpatient Medications on File Prior to Visit  Medication Sig Dispense Refill  . Ascorbic Acid (VITAMIN C PO) Take 1 tablet by mouth daily.    Marland Kitchen. aspirin 81 MG tablet Take 81 mg by mouth daily as needed for pain (pain).    . Blood Glucose Monitoring Suppl (RELION PRIME MONITOR) DEVI Use to check blood sugar once daily 1 Device 0  . canagliflozin (INVOKANA) 100 MG TABS tablet Take 1 tablet (100 mg total) by mouth daily before  breakfast. 90 tablet 3  . diphenhydrAMINE (BENADRYL) 25 mg capsule Take 25 mg by mouth every 6 (six) hours as needed.    . docusate sodium (COLACE) 100 MG capsule Take 200 mg by mouth daily as needed for mild constipation.    Marland Kitchen. FIBER PO Take 2 tablets by mouth daily.    . fluticasone (FLONASE) 50 MCG/ACT nasal spray Place 2 sprays into both nostrils daily. 16 g 1  . glipiZIDE (GLUCOTROL) 10 MG tablet Take 1 tablet in the morning and 1 tablet in the afternoon. 180 tablet 3  . glucose blood (RELION PRIME TEST) test strip Use as instructed 50 each 1  . ibuprofen (ADVIL,MOTRIN) 200 MG tablet Take 600 mg by mouth every 6 (six) hours as needed for moderate pain (pain).    Marland Kitchen. lisinopril-hydrochlorothiazide (PRINZIDE,ZESTORETIC) 20-25 MG tablet Take 1 tablet by mouth daily. 90 tablet 3  . metFORMIN (GLUCOPHAGE) 500 MG tablet Take 2 tablets (1,000 mg total) by mouth 2 (two) times daily with a meal. 240 tablet 5  . Multiple Vitamins-Minerals (CENTRUM ADULTS PO) Take 1 tablet by mouth daily.    . predniSONE (DELTASONE) 10 MG tablet 6 tabs po days 1-2, 5 tabs po days 3-4, 4 tabs po days 5-6, 3 tabs po days 7-8, 2 tabs po days 9-10, 1 tab po days 11-12 42 tablet 0  . Probiotic Product (PROBIOTIC DAILY PO) Take 1 tablet by mouth daily as needed.    . Sodium  Phosphates (PHOSPHATE LAXATIVE PO) Take 1 Dose by mouth as needed.     No current facility-administered medications on file prior to visit.     Past Surgical History:  Procedure Laterality Date  . ABDOMINAL HYSTERECTOMY    . BLADDER SURGERY     growth in bladder/sounds like adhesiolysis  . CARPAL TUNNEL RELEASE    . COLONOSCOPY  2007  . MYOMECTOMY    . OOPHORECTOMY      No Known Allergies  Social History   Socioeconomic History  . Marital status: Legally Separated    Spouse name: Not on file  . Number of children: Not on file  . Years of education: Not on file  . Highest education level: Not on file  Occupational History  . Not on file   Social Needs  . Financial resource strain: Not on file  . Food insecurity:    Worry: Not on file    Inability: Not on file  . Transportation needs:    Medical: Not on file    Non-medical: Not on file  Tobacco Use  . Smoking status: Never Smoker  . Smokeless tobacco: Never Used  Substance and Sexual Activity  . Alcohol use: Yes    Comment: occ.  . Drug use: No  . Sexual activity: Not on file  Lifestyle  . Physical activity:    Days per week: Not on file    Minutes per session: Not on file  . Stress: Not on file  Relationships  . Social connections:    Talks on phone: Not on file    Gets together: Not on file    Attends religious service: Not on file    Active member of club or organization: Not on file    Attends meetings of clubs or organizations: Not on file    Relationship status: Not on file  . Intimate partner violence:    Fear of current or ex partner: Not on file    Emotionally abused: Not on file    Physically abused: Not on file    Forced sexual activity: Not on file  Other Topics Concern  . Not on file  Social History Narrative  . Not on file    Family History  Problem Relation Age of Onset  . Diabetes Mother   . Kidney failure Mother   . Obesity Mother   . Deep vein thrombosis Mother   . Diabetes Father   . Diabetes Sister   . Hypertension Maternal Grandmother   . Hyperlipidemia Maternal Grandmother   . Colon cancer Neg Hx     BP (!) 157/81   Pulse 75   Ht 4\' 11"  (1.499 m)   Wt 173 lb 6.4 oz (78.7 kg)   BMI 35.02 kg/m   Review of Systems: See HPI above.     Objective:  Physical Exam:  Gen: NAD, comfortable in exam room  Left hand/wrist: No deformity, swelling, bruising, atrophy. TTP carpal tunnel, less over A1 pulley of 2nd-4th digits.  No other tenderness. FROM digits and wrist except flexion of 2nd PIP limited to 30 degrees.  Full motion DIP MCP joints.  5/5 strength all motions. Sensation diminished 1st, 2nd digits. Otherwise NVI  distally. Positive tinels.  Right hand/wrist: No deformity, swelling, bruising, atrophy. TTP carpal tunnel, less over A1 pulley of 3rd, 4th digits.  No other tenderness. FROM digits and wrist with 5/5 strength all motions. Sensation diminished 1st, 2nd digits. Otherwise NVI distally. Positive tinels.   Assessment & Plan:  1. Bilateral hand/wrist pain - 2/2 carpal tunnel syndrome with concern for multiple concurrent trigger fingers.  Not improved with wrist braces, extended prednisone pack.  Discussed options and carpal tunnel injections given today.  F/u in 1 month - consider NCV/EMGs if not improving still.  Aleve or ibuprofen, tylenol if needed.  Continue wearing wrist braces.  After informed written consent timeout was performed.  Patient was seated in chair in exam room.  Area between right median nerve and ulnar artery identified with ultrasound, prepped with alcohol swab then right carpal tunnel injected with 2:1 bupivicaine: depomedrol.  Patient tolerated procedure well without immediate complications.  After informed written consent timeout was performed.  Patient was seated in chair in exam room.  Area between left median nerve and ulnar artery identified with ultrasound, prepped with alcohol swab then left carpal tunnel injected with 2:1 bupivicaine: depomedrol.  Patient tolerated procedure well without immediate complications.

## 2018-04-28 NOTE — Assessment & Plan Note (Signed)
2/2 carpal tunnel syndrome with concern for multiple concurrent trigger fingers.  Not improved with wrist braces, extended prednisone pack.  Discussed options and carpal tunnel injections given today.  F/u in 1 month - consider NCV/EMGs if not improving still.  Aleve or ibuprofen, tylenol if needed.  Continue wearing wrist braces.  After informed written consent timeout was performed.  Patient was seated in chair in exam room.  Area between right median nerve and ulnar artery identified with ultrasound, prepped with alcohol swab then right carpal tunnel injected with 2:1 bupivicaine: depomedrol.  Patient tolerated procedure well without immediate complications.  After informed written consent timeout was performed.  Patient was seated in chair in exam room.  Area between left median nerve and ulnar artery identified with ultrasound, prepped with alcohol swab then left carpal tunnel injected with 2:1 bupivicaine: depomedrol.  Patient tolerated procedure well without immediate complications.

## 2018-04-28 NOTE — Patient Instructions (Signed)
You have carpal tunnel syndrome. I'm also concerned about you having trigger fingers as well. Continue to wear the wrist braces at nighttime and as often as possible during the day Aleve OR ibuprofen if needed. Ok to take tylenol with one of these. We did the steroid injections today. If not improving, can consider nerve conduction studies to assess severity. Follow up with me in 1 month but call me sooner if needed.

## 2018-05-19 ENCOUNTER — Ambulatory Visit: Payer: BLUE CROSS/BLUE SHIELD | Admitting: Family Medicine

## 2018-05-19 VITALS — BP 125/75 | HR 79 | Temp 98.8°F

## 2018-05-19 DIAGNOSIS — Z0000 Encounter for general adult medical examination without abnormal findings: Secondary | ICD-10-CM

## 2018-05-19 DIAGNOSIS — E119 Type 2 diabetes mellitus without complications: Secondary | ICD-10-CM | POA: Diagnosis not present

## 2018-05-19 DIAGNOSIS — I10 Essential (primary) hypertension: Secondary | ICD-10-CM

## 2018-05-19 NOTE — Patient Instructions (Signed)
Thank you for coming in to see us today. Please see below to review our plan for today's visit.  1.  I placed an order for your labs.  You can come in at any point in the next couple of weeks to get these done.  Please try to be fasting.  The only thing you can have it is coffee without any additives. 2.  You are overdue for a Pap smear.  We can arrange for you to have a visit in the next month for me to get this updated. 3.  Diabetes seems to be well-controlled along with her blood pressure.  Continue your current regimen.  Your feet do seem to be doing well.  Please call the clinic at [phone] if your symptoms worsen or you have any concerns. It was our pleasure to serve you.  Durward Parcelavid McMullen, DO [facility], PGY-2

## 2018-05-19 NOTE — Progress Notes (Addendum)
   Subjective   Patient ID: Lindsey Johnston    DOB: August 26, 1961, 57 y.o. female   MRN: [id number]  CC: "Annual physical exam"  HPI: Lindsey Johnston is a 57 y.o. female who presents to clinic today for the following:  Annual physical exam: Patient is here today for her annual physical.  She is overdue for her Pap smear.  She is a never smoker.  Patient denies fevers or chills, nausea or vomiting, chest pain, shortness of breath, abdominal pain, dysuria, conservation or diarrhea, melena or hematochezia.  Diabetes: Long-standing with out insulin use.  She was recently seen for her diabetes with an A1c of 6.7 approximately 2 months ago.  She has been adherent to her medications.  She sees an ophthalmologist annually.  She is overdue for her diabetic foot exam.  She does not check her CBGs.  Hypertension: Patient is a never smoker. She is adherent to her antihypertensives.  ROS: see HPI for pertinent.  [place]: DM, anemia, obesity. H/o TAHBSO, bladder surgery (adhesiolysis), carpal tunnel release. Mother DM, CKD, obesity, DVT, father DM, sister DM. Smoking status reviewed. Medications reviewed.  Objective   BP 100/70   Pulse 60   Temp 99.3 F (37.4 C)   SpO2 98%  Vitals and nursing note reviewed.  General: well nourished, well developed, NAD with non-toxic appearance HEENT: normocephalic, atraumatic, moist mucous membranes Neck: supple, non-tender without lymphadenopathy Cardiovascular: regular rate and rhythm without murmurs, rubs, or gallops Lungs: clear to auscultation bilaterally with normal work of breathing Abdomen: soft, non-tender, non-distended, normoactive bowel sounds Skin: warm, dry, no rashes or lesions, cap refill < 2 seconds Extremities: warm and well perfused, normal tone, no edema  Assessment & Plan   Encounter for annual physical exam Up-to-date on vaccinations.  Overdue for Pap smear.  Well-controlled diabetic.  Non-smoker. - Discussed importance of diet and  exercise - RTC 1 month for Pap smear  Primary hypertension Controlled.  Non-smoker.  On daily aspirin.  Diabetic. - Future order placed for CBC, CMET, TSH, fasting lipid panel - Continue lisinopril-HCTZ 20-25 mg daily and aspirin 81 mg daily  Controlled type 2 diabetes mellitus, without long-term current use of insulin (HCC) Chronic.  Controlled.  Not requiring insulin.  Adherent to diabetic regimen.  Followed by ophthalmologist for annual eye exams. - Diabetic foot exam performed, HM updated - Discussed importance of exercise and carbohydrate restriction - Continue Invokana 100 mg daily, glipizide 10 mg daily and metformin 1000 mg twice daily - Next A1c 08/19/2018  Orders Placed This Encounter  Procedures  . CBC    Standing Status:   Future    Standing Expiration Date:   05/20/2019  . CMP14+EGFR    Standing Status:   Future    Standing Expiration Date:   05/20/2019  . TSH    Standing Status:   Future    Standing Expiration Date:   05/20/2019  . Lipid panel    Standing Status:   Future    Standing Expiration Date:   05/20/2019   No orders of the defined types were placed in this encounter.   Harriet Butte, [place], PGY-2 05/20/2018, 1:34 PM

## 2018-05-20 ENCOUNTER — Encounter: Payer: Self-pay | Admitting: Family Medicine

## 2018-05-20 NOTE — Assessment & Plan Note (Addendum)
Controlled.  Non-smoker.  On daily aspirin.  Diabetic. - Future order placed for CBC, CMET, TSH, fasting lipid panel - Continue lisinopril-HCTZ 20-25 mg daily and aspirin 81 mg daily

## 2018-05-20 NOTE — Assessment & Plan Note (Addendum)
Up-to-date on vaccinations.  Overdue for Pap smear.  Well-controlled diabetic.  Non-smoker. - Discussed importance of diet and exercise - RTC 1 month for Pap smear

## 2018-05-20 NOTE — Assessment & Plan Note (Addendum)
Chronic.  Controlled.  Not requiring insulin.  Adherent to diabetic regimen.  Followed by ophthalmologist for annual eye exams. - Diabetic foot exam performed, HM updated - Discussed importance of exercise and carbohydrate restriction - Continue Invokana 100 mg daily, glipizide 10 mg daily and metformin 1000 mg twice daily - Next A1c 08/19/2018

## 2018-05-26 ENCOUNTER — Other Ambulatory Visit: Payer: BLUE CROSS/BLUE SHIELD

## 2018-06-02 ENCOUNTER — Ambulatory Visit: Payer: BLUE CROSS/BLUE SHIELD | Admitting: Family Medicine

## 2018-06-02 ENCOUNTER — Encounter: Payer: Self-pay | Admitting: Family Medicine

## 2018-06-02 VITALS — BP 137/75 | HR 80 | Ht 59.0 in | Wt 172.8 lb

## 2018-06-02 DIAGNOSIS — M25532 Pain in left wrist: Secondary | ICD-10-CM | POA: Diagnosis not present

## 2018-06-02 DIAGNOSIS — M25531 Pain in right wrist: Secondary | ICD-10-CM | POA: Diagnosis not present

## 2018-06-02 NOTE — Patient Instructions (Signed)
We will go ahead with nerve conduction studies/EMGs of your upper extremities. I will contact you with the results and next steps. Continue the wrist braces in meantime.

## 2018-06-05 ENCOUNTER — Encounter: Payer: Self-pay | Admitting: Family Medicine

## 2018-06-05 NOTE — Assessment & Plan Note (Signed)
history and exam concerning for carpal tunnel syndrome with concurrent trigger fingers.  Unfortunately did not improve with cortisone injections into carpal tunnels.  Will go ahead with NCV/EMGs.  Tylenol, aleve if needed.  Can consider extended prednisone dose pack, hand surgeon referral depending on results.  Continue wrist braces.

## 2018-06-05 NOTE — Progress Notes (Addendum)
PCP: Wendee Beavers, DO Consultation requested by: Dr. Cathlean Cower  Subjective:   HPI: Patient is a 57 y.o. female here for bilateral hand/wrist pain.  5/10: Patient reports for about 5 months she has had fairly severe right > left wrist pain. Ain 10/10 and sharp on left, 7/10 on right. She had surgery on right carpal tunnel with release of thumb and pinky digits (describes trigger fingers) about 10 years ago. Was told she wouldn't return to 100% She is having trouble bending her left index, right middle and ring fingers. Difficulty at work, with lifting and trying to turn things. Tried support bands of wrist/hand, prednisone dose packs. Prednisone helped only temporarily. No skin changes. Numbness into thumb, index fingers mainly on the left.  6/7: Patient returns feeling no better than last visit. No changes with extended course of prednisone, wearing wrist braces regularly. Pain is 10/10 on left, 9/10 on right and sharp. She's getting catching of right ring, sometimes middle finger, left 2nd-4th digits. Numbness into thumb and index fingers bilaterally. No skin changes.  7/12: Patient reports injections only provided minimal relief. She's having 7-8/10 level pain in left wrist including catching of left index and middle fingers. She's also having 9/10 pain right side with catching of right 3rd and 4th digits. Associated tingling especially on the left. She did have surgery for carpal tunnel and trigger finger on right about 15 years ago. No new injuries. No skin changes.  Past Medical History:  Diagnosis Date  . Allergic rhinitis   . Anemia   . Asthma   . Carpal tunnel syndrome   . Constipation   . DM (diabetes mellitus) (HCC)   . Duodenitis 02/15/2006  . Dysphagia 02/15/2006   maloney dilation  . Erosive gastritis 02/15/2006  . Gastritis 02/15/2006   proximal non-erosive  . Hemorrhoids    internal and external  . Hypertension   . Obese     Current Outpatient  Medications on File Prior to Visit  Medication Sig Dispense Refill  . Ascorbic Acid (VITAMIN C PO) Take 1 tablet by mouth daily.    Marland Kitchen aspirin 81 MG tablet Take 81 mg by mouth daily as needed for pain (pain).    . Blood Glucose Monitoring Suppl (RELION PRIME MONITOR) DEVI Use to check blood sugar once daily 1 Device 0  . canagliflozin (INVOKANA) 100 MG TABS tablet Take 1 tablet (100 mg total) by mouth daily before breakfast. 90 tablet 3  . diphenhydrAMINE (BENADRYL) 25 mg capsule Take 25 mg by mouth every 6 (six) hours as needed.    . docusate sodium (COLACE) 100 MG capsule Take 200 mg by mouth daily as needed for mild constipation.    Marland Kitchen FIBER PO Take 2 tablets by mouth daily.    . fluticasone (FLONASE) 50 MCG/ACT nasal spray Place 2 sprays into both nostrils daily. 16 g 1  . glipiZIDE (GLUCOTROL) 10 MG tablet Take 1 tablet in the morning and 1 tablet in the afternoon. 180 tablet 3  . glucose blood (RELION PRIME TEST) test strip Use as instructed 50 each 1  . ibuprofen (ADVIL,MOTRIN) 200 MG tablet Take 600 mg by mouth every 6 (six) hours as needed for moderate pain (pain).    Marland Kitchen lisinopril-hydrochlorothiazide (PRINZIDE,ZESTORETIC) 20-25 MG tablet Take 1 tablet by mouth daily. 90 tablet 3  . metFORMIN (GLUCOPHAGE) 500 MG tablet Take 2 tablets (1,000 mg total) by mouth 2 (two) times daily with a meal. 240 tablet 5  . Multiple Vitamins-Minerals (CENTRUM ADULTS  PO) Take 1 tablet by mouth daily.    . Probiotic Product (PROBIOTIC DAILY PO) Take 1 tablet by mouth daily as needed.    . Sodium Phosphates (PHOSPHATE LAXATIVE PO) Take 1 Dose by mouth as needed.     No current facility-administered medications on file prior to visit.     Past Surgical History:  Procedure Laterality Date  . ABDOMINAL HYSTERECTOMY    . BLADDER SURGERY     growth in bladder/sounds like adhesiolysis  . CARPAL TUNNEL RELEASE    . COLONOSCOPY  2007  . MYOMECTOMY    . OOPHORECTOMY      No Known Allergies  Social  History   Socioeconomic History  . Marital status: Legally Separated    Spouse name: Not on file  . Number of children: Not on file  . Years of education: Not on file  . Highest education level: Not on file  Occupational History  . Not on file  Social Needs  . Financial resource strain: Not on file  . Food insecurity:    Worry: Not on file    Inability: Not on file  . Transportation needs:    Medical: Not on file    Non-medical: Not on file  Tobacco Use  . Smoking status: Never Smoker  . Smokeless tobacco: Never Used  Substance and Sexual Activity  . Alcohol use: Yes    Comment: occ.  . Drug use: No  . Sexual activity: Not on file  Lifestyle  . Physical activity:    Days per week: Not on file    Minutes per session: Not on file  . Stress: Not on file  Relationships  . Social connections:    Talks on phone: Not on file    Gets together: Not on file    Attends religious service: Not on file    Active member of club or organization: Not on file    Attends meetings of clubs or organizations: Not on file    Relationship status: Not on file  . Intimate partner violence:    Fear of current or ex partner: Not on file    Emotionally abused: Not on file    Physically abused: Not on file    Forced sexual activity: Not on file  Other Topics Concern  . Not on file  Social History Narrative  . Not on file    Family History  Problem Relation Age of Onset  . Diabetes Mother   . Kidney failure Mother   . Obesity Mother   . Deep vein thrombosis Mother   . Diabetes Father   . Diabetes Sister   . Hypertension Maternal Grandmother   . Hyperlipidemia Maternal Grandmother   . Colon cancer Neg Hx     BP 137/75   Pulse 80   Ht 4\' 11"  (1.499 m)   Wt 172 lb 12.8 oz (78.4 kg)   BMI 34.90 kg/m   Review of Systems: See HPI above.     Objective:  Physical Exam:  Gen: NAD, comfortable in exam room  Left hand/wrist: No deformity, swelling, bruising, atrophy. FROM with  5/5 strength of wrist and digits. TTP carpal tunnel and over 3rd, 2nd digit A1 pulleys. Sensation diminished 1st, 2nd digits. NVI distally otherwise. Positive tinels.  Right hand/wrist: No deformity, swelling, bruising, atrophy. FROM with 5/5 strength of wrist and digits. TTP carpal tunnel and over 3rd, 4th digit A1 pulleys primarily, less over 2nd. Sensaton intact to light touch. NVI distally otherwise. Positive  tinels.   Assessment & Plan:  1. Bilateral hand/wrist pain - history and exam concerning for carpal tunnel syndrome with concurrent trigger fingers.  Unfortunately did not improve with cortisone injections into carpal tunnels.  Will go ahead with NCV/EMGs.  Tylenol, aleve if needed.  Can consider extended prednisone dose pack, hand surgeon referral depending on results.  Continue wrist braces.  Addendum:  NCVs/EMGs reviewed and discussed with patient.  Unfortunately she has severe bilateral carpal tunnel, left worse than right.  I advised her she likely has permanent nerve damage to some extent given the severity, demyelinating on the left but will refer to hand surgery to discuss possibility of releases to alleviate some of symptoms.  Sending in extended course of prednisone again, encouraged wrist braces.  She declined gabapentin.

## 2018-06-11 ENCOUNTER — Other Ambulatory Visit: Payer: Self-pay | Admitting: Family Medicine

## 2018-06-11 DIAGNOSIS — E1165 Type 2 diabetes mellitus with hyperglycemia: Secondary | ICD-10-CM

## 2018-06-12 ENCOUNTER — Other Ambulatory Visit: Payer: Self-pay

## 2018-06-12 DIAGNOSIS — E1165 Type 2 diabetes mellitus with hyperglycemia: Secondary | ICD-10-CM

## 2018-06-13 MED ORDER — CANAGLIFLOZIN 100 MG PO TABS: ORAL_TABLET | ORAL | 3 refills | Status: DC

## 2018-06-23 ENCOUNTER — Other Ambulatory Visit: Payer: BLUE CROSS/BLUE SHIELD

## 2018-06-23 DIAGNOSIS — E119 Type 2 diabetes mellitus without complications: Secondary | ICD-10-CM

## 2018-06-24 LAB — CMP14+EGFR
ALBUMIN: 4.2 g/dL (ref 3.5–5.5)
ALT: 13 IU/L (ref 0–32)
AST: 17 IU/L (ref 0–40)
Albumin/Globulin Ratio: 1.5 (ref 1.2–2.2)
Alkaline Phosphatase: 72 IU/L (ref 39–117)
BUN/Creatinine Ratio: 23 (ref 9–23)
BUN: 17 mg/dL (ref 6–24)
Bilirubin Total: 0.5 mg/dL (ref 0.0–1.2)
CALCIUM: 9.2 mg/dL (ref 8.7–10.2)
CO2: 25 mmol/L (ref 20–29)
Chloride: 100 mmol/L (ref 96–106)
Creatinine, Ser: 0.75 mg/dL (ref 0.57–1.00)
GFR calc Af Amer: 103 mL/min/{1.73_m2} (ref >59)
GFR calc non Af Amer: 89 mL/min/{1.73_m2} (ref >59)
GLOBULIN, TOTAL: 2.8 g/dL (ref 1.5–4.5)
Glucose: 112 mg/dL — ABNORMAL HIGH (ref 65–99)
Potassium: 4.5 mmol/L (ref 3.5–5.2)
SODIUM: 139 mmol/L (ref 134–144)
Total Protein: 7 g/dL (ref 6.0–8.5)

## 2018-06-24 LAB — CBC
Hematocrit: 33.8 % — ABNORMAL LOW (ref 34.0–46.6)
Hemoglobin: 11 g/dL — ABNORMAL LOW (ref 11.1–15.9)
MCH: 24.3 pg — ABNORMAL LOW (ref 26.6–33.0)
MCHC: 32.5 g/dL (ref 31.5–35.7)
MCV: 75 fL — AB (ref 79–97)
PLATELETS: 352 10*3/uL (ref 150–450)
RBC: 4.52 x10E6/uL (ref 3.77–5.28)
RDW: 17.2 % — ABNORMAL HIGH (ref 12.3–15.4)
WBC: 8.9 10*3/uL (ref 3.4–10.8)

## 2018-06-24 LAB — LIPID PANEL
Chol/HDL Ratio: 2.6 ratio (ref 0.0–4.4)
Cholesterol, Total: 174 mg/dL (ref 100–199)
HDL: 66 mg/dL (ref >39)
LDL Calculated: 97 mg/dL (ref 0–99)
TRIGLYCERIDES: 54 mg/dL (ref 0–149)
VLDL Cholesterol Cal: 11 mg/dL (ref 5–40)

## 2018-06-24 LAB — TSH: TSH: 1.18 u[IU]/mL (ref 0.450–4.500)

## 2018-06-29 NOTE — Progress Notes (Signed)
   Subjective   Patient ID: Lindsey Johnston    DOB: [birth year], 57 y.o. female   MRN: [id number]  CC: "Pap smear"  HPI: Lindsey Johnston is a 57 y.o. female who presents to clinic today for the following:  Cervical cancer screening: Patient here today to receive Pap smear.  She is unsure when her last Pap smear was performed.  She does report history of hysterectomy around the year 2000 but cannot recall which kind of hysterectomy.  She reports this was done secondary to fibroids.  She denies history of vaginal discharge or bleeding, pelvic pain, unexplained weight loss.  ROS: see HPI for pertinent.  PMFSH: DM, anemia, obesity.H/o TAHBSO, bladder surgery(adhesiolysis),carpal tunnel release.Mother DM, CKD, obesity, DVT, father DM, sister DM. Smoking status reviewed. Medications reviewed.  Objective   BP (!) 142/72   Pulse 79   Temp 99.1 F (37.3 C) (Oral)   Ht 4\' 11"  (1.499 m)   Wt 165 lb 9.6 oz (75.1 kg)   SpO2 99%   BMI 33.45 kg/m  Vitals and nursing note reviewed.  General: well nourished, well developed, NAD with non-toxic appearance HEENT: normocephalic, atraumatic, moist mucous membranes Cardiovascular: regular rate and rhythm without murmurs, rubs, or gallops Lungs: clear to auscultation bilaterally with normal work of breathing GU: accompanied by chaperone, no external lesions or rashes, no foul odor, minimal vaginal secretions, vaginal cuff appreciated without cervix, no adnexal tenderness Skin: warm, dry, no rashes or lesions, cap refill < 2 seconds Extremities: warm and well perfused, normal tone, no edema  Assessment & Plan   Screening for malignant neoplasm of cervix Prior history of hysterectomy secondary to fibroids.  No family history of uterine or ovarian cancer. - No signs of cervix based on exam, will update health maintenance to reflect this - Instructed patient to follow-up for gynecological exams as needed  No orders of the defined types were placed  in this encounter.  No orders of the defined types were placed in this encounter.   Durward Parcelavid Latrelle Bazar, DO [facility], PGY-3 06/30/2018, 6:14 PM

## 2018-06-30 ENCOUNTER — Other Ambulatory Visit: Payer: Self-pay

## 2018-06-30 ENCOUNTER — Ambulatory Visit: Payer: BLUE CROSS/BLUE SHIELD | Admitting: Family Medicine

## 2018-06-30 ENCOUNTER — Encounter: Payer: Self-pay | Admitting: Family Medicine

## 2018-06-30 VITALS — BP 142/72 | HR 79 | Temp 99.1°F | Ht 59.0 in | Wt 165.6 lb

## 2018-06-30 DIAGNOSIS — Z124 Encounter for screening for malignant neoplasm of cervix: Secondary | ICD-10-CM | POA: Diagnosis not present

## 2018-06-30 NOTE — Assessment & Plan Note (Signed)
Prior history of hysterectomy secondary to fibroids.  No family history of uterine or ovarian cancer. - No signs of cervix based on exam, will update health maintenance to reflect this - Instructed patient to follow-up for gynecological exams as needed

## 2018-06-30 NOTE — Patient Instructions (Signed)
Thank you for coming in to see us today. Please see below to review our plan for today's visit.  1.  You previously were referred by me last year to see Dr. Mack HookAndrew Mincey [facility] [phone].  Please call and request to be scheduled for your next eye appointment and have them fax the results to me. 2.  He did not have a cervix on my exam.  I will update this in your chart to reflect this.  You should not need a Pap smear from here on out. 3.  We will see you again around the end of September for your next diabetic exam.  Please call the clinic at [phone] if your symptoms worsen or you have any concerns. It was our pleasure to serve you.  Durward Parcelavid Kourtland Coopman, DO [facility], PGY-3

## 2018-07-04 ENCOUNTER — Telehealth: Payer: Self-pay | Admitting: Family Medicine

## 2018-07-04 NOTE — Telephone Encounter (Signed)
Pt said she would call back to make her appt  - Patient's next A1c due on 08/19/2018.

## 2018-07-14 ENCOUNTER — Encounter: Payer: Self-pay | Admitting: Neurology

## 2018-07-14 ENCOUNTER — Ambulatory Visit (INDEPENDENT_AMBULATORY_CARE_PROVIDER_SITE_OTHER): Payer: BLUE CROSS/BLUE SHIELD | Admitting: Neurology

## 2018-07-14 DIAGNOSIS — Z0289 Encounter for other administrative examinations: Secondary | ICD-10-CM

## 2018-07-14 DIAGNOSIS — G5603 Carpal tunnel syndrome, bilateral upper limbs: Secondary | ICD-10-CM | POA: Diagnosis not present

## 2018-07-14 NOTE — Procedures (Signed)
Full Name: Lindsey Johnston Gender: Female MRN #: [id number] Date of Birth: 16-Sep-2061    Visit Date: 07/14/18 12:04 Age: 57 Years 9 Months Old Examining Physician: Levert Feinstein, MD  Referring Physician: Norton Blizzard, MD History: 57 year old right-handed female, history of right carpal tunnel release surgery, presented with worsening bilateral hands paresthesia, weakness.  Summary of the tests:  Nerve conduction study: Right ulnar sensory responses showed significantly decreased snap amplitude, with normal peak latency.  Left ulnar sensory and motor responses were normal. Right ulnar motor responses showed mildly decreased the C map amplitude, with 19 m/s velocity drop across the elbow, there was no significant C map drop across elbow.  Left median sensory response was absent.  Right median sensory response showed moderately prolonged peak latency with significantly decreased snap amplitude. Bilateral median motor responses showed prolonged distal latency, and decreased C map amplitude left side is severe, right side is moderate,  Electromyography: Selective needle examinations were performed at bilateral upper extremity muscles, left abductor pollicis brevis.  There is evidence of increased insertional activity, chronic neuropathic changes at bilateral abductor pollicis brevis.  Rest of the needle examination showed no significant abnormalities.    Conclusion:  This is an abnormal study.  There is electrodiagnostic evidence of bilateral median neuropathy across the wrist, consistent with severe bilateral carpal tunnel syndromes, left side is worse, mainly demyelinating natures, there is no evidence of right cervical radiculopathy.   ------------------------------- Levert Feinstein, M.D. PhD  [facility] [address] [place], [place] [address] Tel: [phone] Fax: [phone]        [facility]    Nerve / Sites Muscle Latency Ref. Amplitude Ref. Rel Amp Segments  Distance Velocity Ref. Area    ms ms mV mV %  cm m/s m/s mVms  L Median - APB     Wrist APB 11.3 ?4.4 1.0 ?4.0 100 Wrist - APB 7   4.2     Upper arm APB 14.6  0.7  69.7 Upper arm - Wrist 19 57 ?49 2.8  R Median - APB     Wrist APB 5.0 ?4.4 3.5 ?4.0 100 Wrist - APB 7   10.3     Upper arm APB 9.1  3.0  86.8 Upper arm - Wrist 20 49 ?49 9.5  L Ulnar - ADM     Wrist ADM 2.5 ?3.3 5.9 ?6.0 100 Wrist - ADM 7   17.4     B.Elbow ADM 6.5  5.4  92.4 B.Elbow - Wrist 20 51 ?49 17.4     A.Elbow ADM 8.5  5.5  101 A.Elbow - B.Elbow 10 49 ?49 17.5         A.Elbow - Wrist      R Ulnar - ADM     Wrist ADM 3.2 ?3.3 4.1 ?6.0 100 Wrist - ADM 7   14.0     B.Elbow ADM 7.0  3.6  87.5 B.Elbow - Wrist 20 52 ?49 11.6     A.Elbow ADM 10.1  2.9  82.4 A.Elbow - B.Elbow 10 33 ?49 10.4         A.Elbow - Wrist                 SNC    Nerve / Sites Rec. Site Peak Lat Ref.  Amp Ref. Segments Distance    ms ms V V  cm  L Median - Orthodromic (Dig II, Mid palm)     Dig II Wrist NR ?3.4  NR ?10 Dig II - Wrist 13  R Median - Orthodromic (Dig II, Mid palm)     Dig II Wrist 4.3 ?3.4 4 ?10 Dig II - Wrist 13  L Ulnar - Orthodromic, (Dig V, Mid palm)     Dig V Wrist 2.8 ?3.1 6 ?5 Dig V - Wrist 11  R Ulnar - Orthodromic, (Dig V, Mid palm)     Dig V Wrist 2.9 ?3.1 1 ?5 Dig V - Wrist 5811              F  Wave    Nerve F Lat Ref.   ms ms  L Ulnar - ADM 29.2 ?32.0  R Ulnar - ADM 31.4 ?32.0         EMG full       EMG Summary Table    Spontaneous MUAP Recruitment  Muscle IA Fib PSW Fasc Other Amp Dur. Poly Pattern  R. Abductor pollicis brevis Normal None None None _______ Normal Normal Normal Normal  R. Pronator teres Normal None None None _______ Normal Normal Normal Normal  R. Triceps brachii Normal None None None _______ Normal Normal Normal Normal  R. Deltoid Normal None None None _______ Normal Normal Normal Normal  R. Biceps brachii Normal None None None _______ Normal Normal Normal Normal  L. First dorsal  interosseous Normal None None None _______ Normal Normal Normal Normal  L. Abductor pollicis brevis Normal None None None _______ Normal Normal Normal Normal  R. Cervical paraspinals Normal None None None _______ Normal Normal Normal Normal

## 2018-07-27 ENCOUNTER — Encounter: Payer: Self-pay | Admitting: Family Medicine

## 2018-07-27 MED ORDER — PREDNISONE 10 MG PO TABS: ORAL_TABLET | ORAL | 0 refills | Status: AC

## 2018-07-27 NOTE — Addendum Note (Signed)
Addended by: Lenda Kelp on: 07/27/2018 05:23 PM   Modules accepted: Orders

## 2018-10-13 ENCOUNTER — Other Ambulatory Visit: Payer: Self-pay | Admitting: Family Medicine

## 2018-10-13 DIAGNOSIS — E1165 Type 2 diabetes mellitus with hyperglycemia: Secondary | ICD-10-CM

## 2018-11-01 ENCOUNTER — Other Ambulatory Visit: Payer: Self-pay | Admitting: Family Medicine

## 2018-11-01 DIAGNOSIS — E1165 Type 2 diabetes mellitus with hyperglycemia: Secondary | ICD-10-CM

## 2018-11-20 ENCOUNTER — Other Ambulatory Visit: Payer: Self-pay

## 2018-11-20 ENCOUNTER — Ambulatory Visit (HOSPITAL_COMMUNITY)
Admission: EM | Admit: 2018-11-20 | Discharge: 2018-11-20 | Disposition: A | Discharge status: Home / Self Care | Payer: BLUE CROSS/BLUE SHIELD | Attending: Physician Assistant | Admitting: Physician Assistant

## 2018-11-20 ENCOUNTER — Encounter (HOSPITAL_COMMUNITY): Payer: Self-pay

## 2018-11-20 DIAGNOSIS — R21 Rash and other nonspecific skin eruption: Secondary | ICD-10-CM | POA: Insufficient documentation

## 2018-11-20 MED ORDER — TRIAMCINOLONE ACETONIDE 0.1 % EX CREA: 1.0000 "application " | TOPICAL_CREAM | Freq: Two times a day (BID) | CUTANEOUS | 0 refills | Status: AC

## 2018-11-20 NOTE — ED Triage Notes (Signed)
Pt cc ras on her left wrist. Pt states the blisters  are painful and she popped the blisters. X 5 days

## 2018-11-20 NOTE — ED Provider Notes (Signed)
[facility]    CSN: [id number] Arrival date & time: 11/20/18  [id number]     History   Chief Complaint Chief Complaint  Patient presents with  . Rash    HPI Mikal PlaneDebra J Ludolph is a 57 y.o. female.   57 year old female comes in for 5 day history of blisters to the left wrist. States it is painful and irritated. Denies burning. Mild itching. Denies fever, chills, night sweats. Denies rash spreading. Denies new exposures. Wears a wrist splint daily due to carpel tunnel, which she has removed since blisters started. States popped the blisters to see if it would help with symptoms. Has been dressing wound with bandaid.      Past Medical History:  Diagnosis Date  . Allergic rhinitis   . Anemia   . Asthma   . Carpal tunnel syndrome   . Constipation   . DM (diabetes mellitus) (HCC)   . Duodenitis 02/15/2006  . Dysphagia 02/15/2006   maloney dilation  . Erosive gastritis 02/15/2006  . Gastritis 02/15/2006   proximal non-erosive  . Hemorrhoids    internal and external  . Hypertension   . Obese     Patient Active Problem List   Diagnosis Date Noted  . Bilateral wrist pain 03/17/2018  . Controlled type 2 diabetes mellitus, without long-term current use of insulin (HCC) 08/24/2007  . Obesity (BMI 30-39.9) 08/24/2007  . Anemia 08/24/2007  . Primary hypertension 08/24/2007  . Allergic rhinitis 08/24/2007    Past Surgical History:  Procedure Laterality Date  . ABDOMINAL HYSTERECTOMY    . BLADDER SURGERY     growth in bladder/sounds like adhesiolysis  . CARPAL TUNNEL RELEASE    . COLONOSCOPY  2007  . MYOMECTOMY    . OOPHORECTOMY      OB History   No obstetric history on file.      Home Medications    Prior to Admission medications   Medication Sig Start Date End Date Taking? Authorizing Provider  Ascorbic Acid (VITAMIN C PO) Take 1 tablet by mouth daily.    [provider]  aspirin 81 MG tablet Take 81 mg by mouth daily as needed for pain (pain).     [provider]  Blood Glucose Monitoring Suppl (RELION PRIME MONITOR) DEVI Use to check blood sugar once daily 09/21/16   Joanna Pufforsey, Crystal S, MD  canagliflozin [facility](INVOKANA) 100 MG TABS tablet TAKE 1 TABLET BY MOUTH ONCE DAILY BEFORE BREAKFAST 06/13/18   Wendee BeaversMcMullen, David J, DO  diphenhydrAMINE (BENADRYL) 25 mg capsule Take 25 mg by mouth every 6 (six) hours as needed.    [provider]  docusate sodium (COLACE) 100 MG capsule Take 200 mg by mouth daily as needed for mild constipation.    [provider]  FIBER PO Take 2 tablets by mouth daily.    [provider]  fluticasone (FLONASE) 50 MCG/ACT nasal spray Place 2 sprays into both nostrils daily. 08/24/16   Joanna Pufforsey, Crystal S, MD  glipiZIDE (GLUCOTROL) 10 MG tablet TAKE 1 TABLET BY MOUTH ONCE DAILY IN THE MORNING AND  1  TABLET  IN  THE  AFTERNOON 10/16/18   Wendee BeaversMcMullen, David J, DO  glucose blood (RELION PRIME TEST) test strip Use as instructed 09/21/16   Joanna Pufforsey, Crystal S, MD  ibuprofen (ADVIL,MOTRIN) 200 MG tablet Take 600 mg by mouth every 6 (six) hours as needed for moderate pain (pain).    [provider]  lisinopril-hydrochlorothiazide (PRINZIDE,ZESTORETIC) 20-25 MG tablet TAKE 1 TABLET  BY MOUTH ONCE DAILY 06/12/18   Wendee BeaversMcMullen, David J, DO  metFORMIN (GLUCOPHAGE) 500 MG tablet TAKE 2 TABLETS BY MOUTH TWICE DAILY WITH A MEAL 11/02/18   Wendee BeaversMcMullen, David J, DO  Multiple Vitamins-Minerals (CENTRUM ADULTS PO) Take 1 tablet by mouth daily.    [provider]  predniSONE (DELTASONE) 10 MG tablet 6 tabs po days 1-2, 5 tabs po days 3-4, 4 tabs po days 5-6, 3 tabs po days 7-8, 2 tabs po days 9-10, 1 tab po days 11-12 07/27/18   Hudnall, Azucena FallenShane R, MD  Probiotic Product (PROBIOTIC DAILY PO) Take 1 tablet by mouth daily as needed.    [provider]  Sodium Phosphates (PHOSPHATE LAXATIVE PO) Take 1 Dose by mouth as needed.    [provider]  triamcinolone cream (KENALOG) 0.1 % Apply 1 application  topically 2 (two) times daily. 11/20/18   Belinda FisherYu, Elcie Pelster V, PA-C    Family History Family History  Problem Relation Age of Onset  . Diabetes Mother   . Kidney failure Mother   . Obesity Mother   . Deep vein thrombosis Mother   . Diabetes Father   . Diabetes Sister   . Hypertension Maternal Grandmother   . Hyperlipidemia Maternal Grandmother   . Colon cancer Neg Hx     Social History Social History   Tobacco Use  . Smoking status: Never Smoker  . Smokeless tobacco: Never Used  Substance Use Topics  . Alcohol use: Yes    Comment: occ.  . Drug use: No     Allergies   Patient has no known allergies.   Review of Systems Review of Systems  Reason unable to perform ROS: See HPI as above.     Physical Exam Triage Vital Signs ED Triage Vitals  Enc Vitals Group     BP 11/20/18 0918 (!) 127/92     Pulse --      Resp 11/20/18 0918 16     Temp 11/20/18 0918 98.4 F (36.9 C)     Temp Source 11/20/18 0918 Oral     SpO2 11/20/18 0918 98 %     Weight --      Height --      Head Circumference --      Peak Flow --      Pain Score 11/20/18 0915 4     Pain Loc --      Pain Edu? --      Excl. in GC? --    No data found.  Updated Vital Signs BP (!) 127/92 (BP Location: Right Arm)   Temp 98.4 F (36.9 C) (Oral)   Resp 16   SpO2 98%   Physical Exam Constitutional:      General: She is not in acute distress.    Appearance: She is well-developed. She is not diaphoretic.  HENT:     Head: Normocephalic and atraumatic.  Eyes:     Conjunctiva/sclera: Conjunctivae normal.     Pupils: Pupils are equal, round, and reactive to light.  Skin:    General: Skin is warm and dry.     Comments: See picture below. Mild surrounding erythema. No warmth. Mild tenderness to palpation.   Neurological:     Mental Status: She is alert and oriented to person, place, and time.        UC Treatments / Results  Labs (all labs ordered are listed, but only abnormal results are  displayed) Labs Reviewed - No data to display  EKG None  Radiology No results found.  Procedures Procedures (including critical care time)  Medications Ordered in UC Medications - No data to display  Initial Impression / Assessment and Plan / UC Course  I have reviewed the triage vital signs and the nursing notes.  Pertinent labs & imaging results that were available during my care of the patient were reviewed by me and considered in my medical decision making (see chart for details).    Case discussed with Dr Delton See. Will treat for irritant dermatitis, possibly caused by wrist splint. Triamcinolone as directed. Wound care instructions given. Return precautions given. Patient expresses understanding and agrees to plan.  Final Clinical Impressions(s) / UC Diagnoses   Final diagnoses:  Rash    ED Prescriptions    Medication Sig Dispense Auth. Provider   triamcinolone cream (KENALOG) 0.1 % Apply 1 application topically 2 (two) times daily. 30 g Threasa Alpha, [place] 11/20/18 1026

## 2018-11-20 NOTE — Discharge Instructions (Signed)
Exam more consistent with irritant/contact dermatitis. Start triamcinolone cream as directed. This could be due to the brace at night given location. You can use cloth as a barrier to prevent further irritation. Monitor for spreading redness, increased warmth, fever, follow up for reevaluation needed.

## 2019-06-20 ENCOUNTER — Other Ambulatory Visit: Payer: Self-pay

## 2019-06-20 DIAGNOSIS — E1165 Type 2 diabetes mellitus with hyperglycemia: Secondary | ICD-10-CM

## 2019-06-22 ENCOUNTER — Other Ambulatory Visit: Payer: Self-pay

## 2019-06-22 MED ORDER — CANAGLIFLOZIN 100 MG PO TABS: ORAL_TABLET | ORAL | 3 refills | Status: DC

## 2019-06-22 MED ORDER — LISINOPRIL-HYDROCHLOROTHIAZIDE 20-25 MG PO TABS: 1.0000 | ORAL_TABLET | Freq: Every day | ORAL | 3 refills | Status: DC

## 2019-11-14 ENCOUNTER — Other Ambulatory Visit: Payer: Self-pay | Admitting: *Deleted

## 2019-11-14 DIAGNOSIS — E1165 Type 2 diabetes mellitus with hyperglycemia: Secondary | ICD-10-CM

## 2019-11-15 NOTE — Telephone Encounter (Signed)
Could we call to schedule a physical visit for Lindsey Johnston, it appears she has not been seen in 1 year. I would like to follow up with her for her diabetes and medications.

## 2019-11-19 MED ORDER — GLIPIZIDE 10 MG PO TABS: ORAL_TABLET | ORAL | 0 refills | Status: DC

## 2019-11-19 MED ORDER — METFORMIN HCL 500 MG PO TABS: ORAL_TABLET | ORAL | 0 refills | Status: DC

## 2019-11-19 NOTE — Telephone Encounter (Signed)
Called patient and she states that her insurance is not accepted here.  She has not been seen by a doctor all year because of that and Covid-19.  Patient states that she is out of her medication and would like for it to be filled for at least a month which will allow her time to find another provider.   Lindsey Johnston, [place]

## 2020-01-26 ENCOUNTER — Other Ambulatory Visit: Payer: Self-pay | Admitting: Family Medicine

## 2020-01-26 DIAGNOSIS — E1165 Type 2 diabetes mellitus with hyperglycemia: Secondary | ICD-10-CM

## 2020-04-01 ENCOUNTER — Other Ambulatory Visit: Payer: Self-pay | Admitting: Family Medicine

## 2020-04-01 DIAGNOSIS — E1165 Type 2 diabetes mellitus with hyperglycemia: Secondary | ICD-10-CM

## 2020-05-15 ENCOUNTER — Other Ambulatory Visit: Payer: Self-pay | Admitting: Family Medicine

## 2020-05-15 DIAGNOSIS — E1165 Type 2 diabetes mellitus with hyperglycemia: Secondary | ICD-10-CM

## 2020-05-16 ENCOUNTER — Other Ambulatory Visit: Payer: Self-pay | Admitting: Family Medicine

## 2020-05-16 ENCOUNTER — Telehealth: Payer: Self-pay | Admitting: *Deleted

## 2020-05-16 DIAGNOSIS — E1165 Type 2 diabetes mellitus with hyperglycemia: Secondary | ICD-10-CM

## 2020-05-16 MED ORDER — GLIPIZIDE 10 MG PO TABS: ORAL_TABLET | ORAL | 0 refills | Status: DC

## 2020-05-16 MED ORDER — METFORMIN HCL 500 MG PO TABS: ORAL_TABLET | ORAL | 0 refills | Status: DC

## 2020-05-16 NOTE — Progress Notes (Signed)
90 day supply ordered for metformin and glipizide.

## 2020-05-16 NOTE — Telephone Encounter (Signed)
Pt calls requesting refill on metformin and glipizde.  When I called to inform her that she would need an appt, this is what we discovered.  Pt was informed that we did not take her insurance in 2019 ([employer] [place]), she was trying to find a new provider but COVID made this difficult.  She is in the process of finding a new provider now but also has to move because her landlord wants to sell her house.  She is not sure where she will ends up.  She is requesting a 2-3 month refill until she gets everything straightened out.  Advised I would send message to PCP, but could not guarantee that she would be able to. Jone Baseman, CMA

## 2020-05-27 NOTE — Telephone Encounter (Signed)
Provider supplied with 3 month supply of meds.  Jone Baseman, CMA

## 2020-06-16 ENCOUNTER — Other Ambulatory Visit: Payer: Self-pay | Admitting: Family Medicine

## 2020-06-18 ENCOUNTER — Other Ambulatory Visit: Payer: Self-pay | Admitting: Family Medicine

## 2020-08-18 ENCOUNTER — Other Ambulatory Visit: Payer: Self-pay | Admitting: Family Medicine

## 2020-08-18 DIAGNOSIS — E1165 Type 2 diabetes mellitus with hyperglycemia: Secondary | ICD-10-CM

## 2020-09-18 ENCOUNTER — Other Ambulatory Visit: Payer: Self-pay | Admitting: Family Medicine

## 2020-11-16 ENCOUNTER — Other Ambulatory Visit: Payer: Self-pay | Admitting: Family Medicine

## 2020-11-16 DIAGNOSIS — E1165 Type 2 diabetes mellitus with hyperglycemia: Secondary | ICD-10-CM

## 2020-12-19 ENCOUNTER — Other Ambulatory Visit: Payer: Self-pay | Admitting: Family Medicine

## 2021-01-02 ENCOUNTER — Other Ambulatory Visit: Payer: Self-pay | Admitting: Family Medicine

## 2021-01-02 DIAGNOSIS — E1165 Type 2 diabetes mellitus with hyperglycemia: Secondary | ICD-10-CM

## 2021-01-05 ENCOUNTER — Other Ambulatory Visit: Payer: Self-pay | Admitting: Family Medicine

## 2021-01-05 DIAGNOSIS — E1165 Type 2 diabetes mellitus with hyperglycemia: Secondary | ICD-10-CM

## 2021-01-05 MED ORDER — METFORMIN HCL 1000 MG PO TABS: ORAL_TABLET | ORAL | 0 refills | Status: AC

## 2021-01-05 MED ORDER — GLIPIZIDE 10 MG PO TABS
ORAL_TABLET | ORAL | 0 refills | Status: DC
Start: 2021-01-05 — End: 2021-03-16

## 2021-01-05 NOTE — Telephone Encounter (Signed)
Patient calls nurse line regarding refills for glipizide and metformin. Patient reports that she is completely out of metformin and only has one more dose of glipizide.   Patient is taking metformin 2 pills BID, for a total of 4 pills/ day. Quantity of 180 sent to pharmacy, total of 45 day supply.   Advised patient that 3 month supply of glipizide was sent to pharmacy on 12/27. Patient will look in medicine cabinet to see if she has any additional bottles of glipizide. Patient will return call to office tomorrow if she is unable to find glipizide.   Please advise if metformin refill can be sent to pharmacy.   Veronda Prude, RN

## 2021-01-05 NOTE — Telephone Encounter (Signed)
Will provide supply of metformin for 1000mg  BID and glipizide as well. Reviewed patient's chart and she will need an appointment to review medications and labwork for DM and other health maintenance items as she has not been seen in our office since 2019.

## 2021-03-16 ENCOUNTER — Other Ambulatory Visit: Payer: Self-pay

## 2021-03-16 DIAGNOSIS — E1165 Type 2 diabetes mellitus with hyperglycemia: Secondary | ICD-10-CM

## 2021-03-16 MED ORDER — GLIPIZIDE 10 MG PO TABS: ORAL_TABLET | ORAL | 0 refills | Status: AC

## 2021-03-16 MED ORDER — LISINOPRIL-HYDROCHLOROTHIAZIDE 20-25 MG PO TABS: 1.0000 | ORAL_TABLET | Freq: Every day | ORAL | 0 refills | Status: AC

## 2021-03-16 NOTE — Telephone Encounter (Signed)
Patient needs appointment for any further refills.

## 2021-03-16 NOTE — Telephone Encounter (Signed)
Patient calls nurse line stating her new insurance is not allowing her to come to [facility] anymore. Patient has found a new provider and will be seen as a new patient on 5/9. Patient needs refills on glipizide and lisinopril-HCTZ to get her to new provider apt. Please advise.

## 2022-05-17 ENCOUNTER — Encounter (HOSPITAL_BASED_OUTPATIENT_CLINIC_OR_DEPARTMENT_OTHER): Payer: Self-pay | Admitting: Emergency Medicine

## 2022-05-17 ENCOUNTER — Emergency Department (HOSPITAL_BASED_OUTPATIENT_CLINIC_OR_DEPARTMENT_OTHER)
Admission: EM | Admit: 2022-05-17 | Discharge: 2022-05-17 | Disposition: A | Discharge status: Home / Self Care | Payer: BLUE CROSS/BLUE SHIELD | Attending: Emergency Medicine | Admitting: Emergency Medicine

## 2022-05-17 ENCOUNTER — Emergency Department (HOSPITAL_BASED_OUTPATIENT_CLINIC_OR_DEPARTMENT_OTHER): Payer: BLUE CROSS/BLUE SHIELD

## 2022-05-17 ENCOUNTER — Other Ambulatory Visit: Payer: Self-pay

## 2022-05-17 DIAGNOSIS — M25552 Pain in left hip: Secondary | ICD-10-CM | POA: Diagnosis present

## 2022-05-17 DIAGNOSIS — M5432 Sciatica, left side: Secondary | ICD-10-CM

## 2022-05-17 DIAGNOSIS — M545 Low back pain, unspecified: Secondary | ICD-10-CM | POA: Diagnosis not present

## 2022-05-17 MED ORDER — OXYCODONE-ACETAMINOPHEN 5-325 MG PO TABS
1.0000 | ORAL_TABLET | Freq: Once | ORAL | Status: AC
  Administered 2022-05-17: 1 via ORAL
  Filled 2022-05-17: qty 1

## 2022-05-17 MED ORDER — OXYCODONE-ACETAMINOPHEN 5-325 MG PO TABS: 1.0000 | ORAL_TABLET | Freq: Four times a day (QID) | ORAL | 0 refills | Status: AC | PRN

## 2022-05-17 MED ORDER — CYCLOBENZAPRINE HCL 5 MG PO TABS: 5.0000 mg | ORAL_TABLET | Freq: Three times a day (TID) | ORAL | 0 refills | Status: AC | PRN
